# Patient Record
Sex: Female | Born: 1980 | Race: Black or African American | Hispanic: No | Marital: Single | Smoking: Former smoker
Health system: Southern US, Community
[De-identification: ages and names within clinical notes are randomized; demographics above are authoritative.]

## PROBLEM LIST (undated history)

## (undated) DIAGNOSIS — K219 Gastro-esophageal reflux disease without esophagitis: Secondary | ICD-10-CM

## (undated) DIAGNOSIS — I10 Essential (primary) hypertension: Secondary | ICD-10-CM

## (undated) DIAGNOSIS — B019 Varicella without complication: Secondary | ICD-10-CM

## (undated) DIAGNOSIS — F32A Depression, unspecified: Secondary | ICD-10-CM

## (undated) DIAGNOSIS — E785 Hyperlipidemia, unspecified: Secondary | ICD-10-CM

## (undated) DIAGNOSIS — J309 Allergic rhinitis, unspecified: Secondary | ICD-10-CM

## (undated) DIAGNOSIS — F329 Major depressive disorder, single episode, unspecified: Secondary | ICD-10-CM

## (undated) DIAGNOSIS — B977 Papillomavirus as the cause of diseases classified elsewhere: Secondary | ICD-10-CM

## (undated) HISTORY — DX: Varicella without complication: B01.9

## (undated) HISTORY — DX: Major depressive disorder, single episode, unspecified: F32.9

## (undated) HISTORY — DX: Essential (primary) hypertension: I10

## (undated) HISTORY — DX: Hyperlipidemia, unspecified: E78.5

## (undated) HISTORY — DX: Depression, unspecified: F32.A

## (undated) HISTORY — DX: Allergic rhinitis, unspecified: J30.9

---

## 2005-05-07 ENCOUNTER — Emergency Department: Payer: Self-pay | Admitting: Emergency Medicine

## 2006-10-26 ENCOUNTER — Emergency Department: Payer: Self-pay | Admitting: Emergency Medicine

## 2007-01-15 ENCOUNTER — Ambulatory Visit: Payer: Self-pay | Admitting: Family Medicine

## 2007-07-14 ENCOUNTER — Emergency Department: Payer: Self-pay | Admitting: Emergency Medicine

## 2008-10-18 ENCOUNTER — Emergency Department: Payer: Self-pay | Admitting: Emergency Medicine

## 2009-01-30 ENCOUNTER — Emergency Department: Payer: Self-pay | Admitting: Emergency Medicine

## 2009-07-25 ENCOUNTER — Emergency Department: Payer: Self-pay | Admitting: Emergency Medicine

## 2010-04-04 ENCOUNTER — Emergency Department: Payer: Self-pay | Admitting: Emergency Medicine

## 2011-10-19 ENCOUNTER — Emergency Department: Payer: Self-pay | Admitting: Emergency Medicine

## 2012-02-18 ENCOUNTER — Emergency Department: Payer: Self-pay | Admitting: Emergency Medicine

## 2013-05-01 ENCOUNTER — Emergency Department: Payer: Self-pay | Admitting: Emergency Medicine

## 2014-01-06 ENCOUNTER — Encounter (HOSPITAL_COMMUNITY): Payer: Self-pay | Admitting: Emergency Medicine

## 2014-01-06 ENCOUNTER — Emergency Department (HOSPITAL_COMMUNITY)
Admission: EM | Admit: 2014-01-06 | Discharge: 2014-01-06 | Disposition: A | Discharge status: Home / Self Care | Payer: 59 | Attending: Emergency Medicine | Admitting: Emergency Medicine

## 2014-01-06 DIAGNOSIS — Z8639 Personal history of other endocrine, nutritional and metabolic disease: Secondary | ICD-10-CM | POA: Insufficient documentation

## 2014-01-06 DIAGNOSIS — Z862 Personal history of diseases of the blood and blood-forming organs and certain disorders involving the immune mechanism: Secondary | ICD-10-CM | POA: Insufficient documentation

## 2014-01-06 DIAGNOSIS — O200 Threatened abortion: Secondary | ICD-10-CM | POA: Insufficient documentation

## 2014-01-06 DIAGNOSIS — R42 Dizziness and giddiness: Secondary | ICD-10-CM | POA: Insufficient documentation

## 2014-01-06 DIAGNOSIS — R5381 Other malaise: Secondary | ICD-10-CM | POA: Insufficient documentation

## 2014-01-06 DIAGNOSIS — O9989 Other specified diseases and conditions complicating pregnancy, childbirth and the puerperium: Secondary | ICD-10-CM | POA: Insufficient documentation

## 2014-01-06 DIAGNOSIS — Z8619 Personal history of other infectious and parasitic diseases: Secondary | ICD-10-CM | POA: Insufficient documentation

## 2014-01-06 DIAGNOSIS — R5383 Other fatigue: Secondary | ICD-10-CM

## 2014-01-06 HISTORY — DX: Papillomavirus as the cause of diseases classified elsewhere: B97.7

## 2014-01-06 LAB — CBC
HEMATOCRIT: 42 % (ref 36.0–46.0)
HEMOGLOBIN: 13.8 g/dL (ref 12.0–15.0)
MCH: 28.5 pg (ref 26.0–34.0)
MCHC: 32.9 g/dL (ref 30.0–36.0)
MCV: 86.8 fL (ref 78.0–100.0)
Platelets: 273 10*3/uL (ref 150–400)
RBC: 4.84 MIL/uL (ref 3.87–5.11)
RDW: 12.9 % (ref 11.5–15.5)
WBC: 6.2 10*3/uL (ref 4.0–10.5)

## 2014-01-06 LAB — URINALYSIS, ROUTINE W REFLEX MICROSCOPIC
Bilirubin Urine: NEGATIVE
GLUCOSE, UA: NEGATIVE mg/dL
Ketones, ur: NEGATIVE mg/dL
Nitrite: NEGATIVE
PH: 8 (ref 5.0–8.0)
PROTEIN: 30 mg/dL — AB
Specific Gravity, Urine: 1.023 (ref 1.005–1.030)
Urobilinogen, UA: 0.2 mg/dL (ref 0.0–1.0)

## 2014-01-06 LAB — ABO/RH: ABO/RH(D): O POS

## 2014-01-06 LAB — WET PREP, GENITAL
Trich, Wet Prep: NONE SEEN
YEAST WET PREP: NONE SEEN

## 2014-01-06 LAB — PREGNANCY, URINE: PREG TEST UR: POSITIVE — AB

## 2014-01-06 LAB — URINE MICROSCOPIC-ADD ON

## 2014-01-06 LAB — HCG, QUANTITATIVE, PREGNANCY: hCG, Beta Chain, Quant, S: 969 m[IU]/mL — ABNORMAL HIGH (ref <5)

## 2014-01-06 NOTE — ED Provider Notes (Signed)
CSN: [id number]     Arrival date & time 01/06/14  7867 History   First MD Initiated Contact with Patient 01/06/14 [phone]     Chief Complaint  Patient presents with  . Vaginal Bleeding   HPI  Mary Carpenter is a 33 y.o. female with a PMH of PCOS and HPV who presents to the ED for evaluation of vaginal bleeding. History was provided by the patient. Patient states she has had vaginal bleeding since May 3rd (past 10 days). Started as spotting, which has increased in volume the past 2 days. No passage of clots or fetal tissue/masses. Patient went to her OB/GYN yesterday (Dr. Dear) with a positive pregnancy test. LNMP 12/14/13. G0P0. Hx of irregular menses. Patient is currently sexually active with no new partners. Had lower abdominal cramping last week but this has resolved. No abdominal pain currently. Also has had generalized weakness, decreased appetite, and fatigue. Intermittent episodes of lightheadedness. No fever, chills, emesis, nausea, diarrhea, constipation, vaginal discharge, dysuria, genital sores, headaches.    Past Medical History  Diagnosis Date  . Polycystic ovarian syndrome   . HPV (human papilloma virus) infection    History reviewed. No pertinent past surgical history. No family history on file. History  Substance Use Topics  . Smoking status: Never Smoker   . Smokeless tobacco: Not on file  . Alcohol Use: No   OB History   Grav Para Term Preterm Abortions TAB SAB Ect Mult Living                 Review of Systems  Constitutional: Positive for appetite change and fatigue. Negative for fever, chills, diaphoresis and activity change.  Respiratory: Negative for cough and shortness of breath.   Cardiovascular: Negative for chest pain.  Gastrointestinal: Negative for nausea, vomiting, abdominal pain (resolved), diarrhea and constipation.  Genitourinary: Positive for vaginal bleeding. Negative for dysuria, hematuria, flank pain, vaginal discharge, difficulty urinating, genital  sores, vaginal pain and pelvic pain.  Musculoskeletal: Negative for back pain and myalgias.  Neurological: Positive for weakness (generalized) and light-headedness (intermittent). Negative for dizziness and headaches.    Allergies  Review of patient's allergies indicates no known allergies.  Home Medications   Prior to Admission medications   Not on File   BP 140/84  Pulse 80  Temp(Src) 98.1 F (36.7 C) (Oral)  Resp 18  SpO2 100%  LMP 12/14/2013  Filed Vitals:   01/06/14 0844 01/06/14 1017  BP: 140/84 128/73  Pulse: 80 72  Temp: 98.1 F (36.7 C)   TempSrc: Oral   Resp: 18 16  SpO2: 100% 100%    Physical Exam  Nursing note and vitals reviewed. Constitutional: She is oriented to person, place, and time. She appears well-developed and well-nourished. No distress.  HENT:  Head: Normocephalic and atraumatic.  Right Ear: External ear normal.  Left Ear: External ear normal.  Mouth/Throat: Oropharynx is clear and moist.  Eyes: Conjunctivae are normal. Right eye exhibits no discharge. Left eye exhibits no discharge.  Neck: Neck supple.  Cardiovascular: Normal rate, regular rhythm and normal heart sounds.  Exam reveals no gallop and no friction rub.   No murmur heard. Pulmonary/Chest: Effort normal and breath sounds normal. No respiratory distress. She has no wheezes. She has no rales. She exhibits no tenderness.  Abdominal: Soft. Bowel sounds are normal. She exhibits no distension and no mass. There is no tenderness. There is no rebound and no guarding.  Musculoskeletal: Normal range of motion. She exhibits no edema and no  tenderness.  No CVA, lumbar, or flank tenderness bilaterally.   Neurological: She is alert and oriented to person, place, and time.  Skin: Skin is warm and dry. She is not diaphoretic.    ED Course  Procedures (including critical care time) Labs Review Labs Reviewed - No data to display  Imaging Review No results found.   EKG  Interpretation None      Results for orders placed during the hospital encounter of 01/06/14  WET PREP, GENITAL      Result Value Ref Range   Yeast Wet Prep HPF POC NONE SEEN  NONE SEEN   Trich, Wet Prep NONE SEEN  NONE SEEN   Clue Cells Wet Prep HPF POC FEW (*) NONE SEEN   WBC, Wet Prep HPF POC FEW (*) NONE SEEN  PREGNANCY, URINE      Result Value Ref Range   Preg Test, Ur POSITIVE (*) NEGATIVE  URINALYSIS, ROUTINE W REFLEX MICROSCOPIC      Result Value Ref Range   Color, Urine RED (*) YELLOW   APPearance CLOUDY (*) CLEAR   Specific Gravity, Urine 1.023  1.005 - 1.030   pH 8.0  5.0 - 8.0   Glucose, UA NEGATIVE  NEGATIVE mg/dL   Hgb urine dipstick LARGE (*) NEGATIVE   Bilirubin Urine NEGATIVE  NEGATIVE   Ketones, ur NEGATIVE  NEGATIVE mg/dL   Protein, ur 30 (*) NEGATIVE mg/dL   Urobilinogen, UA 0.2  0.0 - 1.0 mg/dL   Nitrite NEGATIVE  NEGATIVE   Leukocytes, UA SMALL (*) NEGATIVE  URINE MICROSCOPIC-ADD ON      Result Value Ref Range   Squamous Epithelial / LPF FEW (*) RARE   WBC, UA 3-6  <3 WBC/hpf   RBC / HPF TOO NUMEROUS TO COUNT  <3 RBC/hpf   Bacteria, UA FEW (*) RARE  CBC      Result Value Ref Range   WBC 6.2  4.0 - 10.5 K/uL   RBC 4.84  3.87 - 5.11 MIL/uL   Hemoglobin 13.8  12.0 - 15.0 g/dL   HCT 16.142.0  09.636.0 - 04.546.0 %   MCV 86.8  78.0 - 100.0 fL   MCH 28.5  26.0 - 34.0 pg   MCHC 32.9  30.0 - 36.0 g/dL   RDW 40.912.9  81.111.5 - 91.415.5 %   Platelets 273  150 - 400 K/uL  HCG, QUANTITATIVE, PREGNANCY      Result Value Ref Range   hCG, Beta Chain, Quant, S 969 (*) <5 mIU/mL  ABO/RH      Result Value Ref Range   ABO/RH(D) O POS     No rh immune globuloin NOT A RH IMMUNE GLOBULIN CANDIDATE, PT RH POSITIVE       MDM   Vanessa DurhamKawana Sutherlin is a 33 y.o. female with a PMH of PCOS and HPV who presents to the ED for evaluation of vaginal bleeding, which is possibly due to a threatened abortion. Patient's Beta HCG 969. H&H stable. Rh positive. Pelvic exam revealed mild vaginal  bleeding with no significant hemorrhage. Os is closed. UA not highly suggestive of a UTI but will send for culture. Patient had no complaints of pain. Abdominal exam benign. Patient has appointment with OB/GYN for blood work tomorrow and Monday for an US. Return precautions, discharge instructions, and follow-up was discussed with the patient before discharge.    Rechecks  10:45 AM = Pelvic exam performed at bedside with ED tech present. Minimal-moderate amount of blood present in the vaginal  vault. No active bleeding/hemorrhage, clots, or tissue. No lacerations to the vaginal walls. No CMT or adnexal tenderness. No vaginal discharge. Os is closed.     There are no discharge medications for this patient.    Final impressions: 1. Threatened abortion       Luiz IronJessica Katlin Hans Rusher PA-C           Jillyn LedgerJessica K Joan Avetisyan, PA-C 01/07/14 [phone]

## 2014-01-06 NOTE — Discharge Instructions (Signed)
Your Beta HCG level was 969 on today's visit - have repeat level drawn tomorrow at your scheduled appointment and keep US appointment on Monday Drink plenty of fluids and rest  Return to the emergency department if you develop any changing/worsening condition, severe abdominal or back pain, feeling faint, vaginal hemorrhage, or any other concerns (please read additional information regarding your condition below)   Threatened Miscarriage Bleeding during the first 20 weeks of pregnancy is common. This is sometimes called a threatened miscarriage. This is a pregnancy that is threatening to end before the twentieth week of pregnancy. Often this bleeding stops with bed rest or decreased activities as suggested by your caregiver and the pregnancy continues without any more problems. You may be asked to not have sexual intercourse, have orgasms or use tampons until further notice. Sometimes a threatened miscarriage can progress to a complete or incomplete miscarriage. This may or may not require further treatment. Some miscarriages occur before a woman misses a menstrual period and knows she is pregnant. Miscarriages occur in 15 to 20% of all pregnancies and usually occur during the first 13 weeks of the pregnancy. The exact cause of a miscarriage is usually never known. A miscarriage is natures way of ending a pregnancy that is abnormal or would not make it to term. There are some things that may put you at risk to have a miscarriage, such as:  Hormone problems.  Infection of the uterus or cervix.  Chronic illness, diabetes for example, especially if it is not controlled.  Abnormal shaped uterus.  Fibroids in the uterus.  Incompetent cervix (the cervix is too weak to hold the baby).  Smoking.  Drinking too much alcohol. It's best not to drink any alcohol when you are pregnant.  Taking illegal drugs. TREATMENT  When a miscarriage becomes complete and all products of conception (all the tissue in  the uterus) have been passed, often no treatment is needed. If you think you passed tissue, save it in a container and take it to your doctor for evaluation. If the miscarriage is incomplete (parts of the fetus or placenta remain in the uterus), further treatment may be needed. The most common reason for further treatment is continued bleeding (hemorrhage) because pregnancy tissue did not pass out of the uterus. This often occurs if a miscarriage is incomplete. Tissue left behind may also become infected. Treatment usually is dilatation and curettage (the removal of the remaining products of pregnancy. This can be done by a simple sucking procedure (suction curettage) or a simple scraping of the inside of the uterus. This may be done in the hospital or in the caregiver's office. This is only done when your caregiver knows that there is no chance for the pregnancy to proceed to term. This is determined by physical examination, negative pregnancy test, falling pregnancy hormone count and/or, an ultrasound revealing a dead fetus. Miscarriages are often a very emotional time for prospective mothers and fathers. This is not you or your partners fault. It did not occur because of an inadequacy in you or your partner. Nearly all miscarriages occur because the pregnancy has started off wrongly. At least half of these pregnancies have a chromosomal abnormality. It is almost always not inherited. Others may have developmental problems with the fetus or placenta. This does not always show up even when the products miscarried are studied under the microscope. The miscarriage is nearly always not your fault and it is not likely that you could have prevented it from  happening. If you are having emotional and grieving problems, talk to your health care provider and even seek counseling, if necessary, before getting pregnant again. You can begin trying for another pregnancy as soon as your caregiver says it is OK. HOME CARE  INSTRUCTIONS   Your caregiver may order bed rest depending on how much bleeding and cramping you are having. You may be limited to only getting up to go to the bathroom. You may be allowed to continue light activity. You may need to make arrangements for the care of your other children and for any other responsibilities.  Keep track of the number of pads you use each day, how often you have to change pads and how saturated (soaked) they are. Record this information.  DO NOT USE TAMPONS. Do not douche, have sexual intercourse or orgasms until approved by your caregiver.  You may receive a follow up appointment for re-evaluation of your pregnancy and a repeat blood test. Re-evaluation often occurs after 2 days and again in 4 to 6 weeks. It is very important that you follow-up in the recommended time period.  If you are Rh negative and the father is Rh positive or you do not know the fathers' blood type, you may receive a shot (Rh immune globulin) to help prevent abnormal antibodies that can develop and affect the baby in any future pregnancies. SEEK IMMEDIATE MEDICAL CARE IF:  You have severe cramps in your stomach, back, or abdomen.  You have a sudden onset of severe pain in the lower part of your abdomen.  You develop chills.  You run an unexplained temperature of 101 F (38.3 C) or higher.  You pass large clots or tissue. Save any tissue for your caregiver to inspect.  Your bleeding increases or you become light-headed, weak, or have fainting episodes.  You have a gush of fluid from your vagina.  You pass out. This could mean you have a tubal (ectopic) pregnancy. Document Released: 08/13/2005 Document Revised: 11/05/2011 Document Reviewed: 03/29/2008 [facility] Patient Information 2014 [place].   Vaginal Bleeding During Pregnancy, First Trimester A small amount of bleeding (spotting) from the vagina is relatively common in early pregnancy. It usually stops on its own. Various  things may cause bleeding or spotting in early pregnancy. Some bleeding may be related to the pregnancy, and some may not. In most cases, the bleeding is normal and is not a problem. However, bleeding can also be a sign of something serious. Be sure to tell your health care provider about any vaginal bleeding right away. Some possible causes of vaginal bleeding during the first trimester include:  Infection or inflammation of the cervix.  Growths (polyps) on the cervix.  Miscarriage or threatened miscarriage.  Pregnancy tissue has developed outside of the uterus and in a fallopian tube (tubal pregnancy).  Tiny cysts have developed in the uterus instead of pregnancy tissue (molar pregnancy). HOME CARE INSTRUCTIONS  Watch your condition for any changes. The following actions may help to lessen any discomfort you are feeling:  Follow your health care provider's instructions for limiting your activity. If your health care provider orders bed rest, you may need to stay in bed and only get up to use the bathroom. However, your health care provider may allow you to continue light activity.  If needed, make plans for someone to help with your regular activities and responsibilities while you are on bed rest.  Keep track of the number of pads you use each day, how  often you change pads, and how soaked (saturated) they are. Write this down.  Do not use tampons. Do not douche.  Do not have sexual intercourse or orgasms until approved by your health care provider.  If you pass any tissue from your vagina, save the tissue so you can show it to your health care provider.  Only take over-the-counter or prescription medicines as directed by your health care provider.  Do not take aspirin because it can make you bleed.  Keep all follow-up appointments as directed by your health care provider. SEEK MEDICAL CARE IF:  You have any vaginal bleeding during any part of your pregnancy.  You have cramps  or labor pains. SEEK IMMEDIATE MEDICAL CARE IF:   You have severe cramps in your back or belly (abdomen).  You have a fever, not controlled by medicine.  You pass large clots or tissue from your vagina.  Your bleeding increases.  You feel lightheaded or weak, or you have fainting episodes.  You have chills.  You are leaking fluid or have a gush of fluid from your vagina.  You pass out while having a bowel movement. MAKE SURE YOU:  Understand these instructions.  Will watch your condition.  Will get help right away if you are not doing well or get worse. Document Released: 05/23/2005 Document Revised: 06/03/2013 Document Reviewed: 04/20/2013 [facility] Patient Information 2014 [place], [place].

## 2014-01-06 NOTE — ED Notes (Signed)
RN attempt to draw blood with IV start. Unsuccessful. Saa, made aware will come get blood.

## 2014-01-06 NOTE — ED Notes (Signed)
Pt reports vaginal bleeding since May 3rd, was told yesterday by her OBGYN that she was pregnant. States wasn;t really told what was going on, was just dx with threatened miscarriage. Has polycystic ovarian syndrome told could have false positive pregnancy test.

## 2014-01-07 LAB — GC/CHLAMYDIA PROBE AMP
CT Probe RNA: NEGATIVE
GC Probe RNA: NEGATIVE

## 2014-01-07 LAB — URINE CULTURE: Colony Count: 15000

## 2014-01-07 NOTE — ED Provider Notes (Signed)
Medical screening examination/treatment/procedure(s) were performed by non-physician practitioner and as supervising physician I was immediately available for consultation/collaboration.   EKG Interpretation None       Raeford RazorStephen Rishan Oyama, MD 01/07/14 [phone]

## 2014-02-28 ENCOUNTER — Emergency Department: Payer: Self-pay | Admitting: Emergency Medicine

## 2015-02-16 ENCOUNTER — Encounter: Payer: Self-pay | Admitting: Urgent Care

## 2015-02-16 DIAGNOSIS — K088 Other specified disorders of teeth and supporting structures: Secondary | ICD-10-CM | POA: Diagnosis not present

## 2015-02-16 NOTE — ED Notes (Signed)
Patient presents with c/o LEFT lower jaw pain - scheduled for an extraction. (+) swelling at time. Denies fever.

## 2015-02-17 ENCOUNTER — Emergency Department
Admission: EM | Admit: 2015-02-17 | Discharge: 2015-02-17 | Discharge status: Against Medical Advice | Payer: 59 | Attending: Emergency Medicine | Admitting: Emergency Medicine

## 2015-05-12 ENCOUNTER — Ambulatory Visit: Payer: 59 | Admitting: Podiatry

## 2015-07-10 ENCOUNTER — Emergency Department
Admission: EM | Admit: 2015-07-10 | Discharge: 2015-07-10 | Disposition: A | Discharge status: Home / Self Care | Payer: 59 | Attending: Emergency Medicine | Admitting: Emergency Medicine

## 2015-07-10 ENCOUNTER — Encounter: Payer: Self-pay | Admitting: Emergency Medicine

## 2015-07-10 DIAGNOSIS — J069 Acute upper respiratory infection, unspecified: Secondary | ICD-10-CM | POA: Diagnosis not present

## 2015-07-10 DIAGNOSIS — R05 Cough: Secondary | ICD-10-CM | POA: Diagnosis present

## 2015-07-10 MED ORDER — PSEUDOEPH-BROMPHEN-DM 30-2-10 MG/5ML PO SYRP: 5.0000 mL | ORAL_SOLUTION | Freq: Four times a day (QID) | ORAL | Status: DC | PRN

## 2015-07-10 NOTE — ED Notes (Signed)
C/o cough, chest congestion x 1 week

## 2015-07-10 NOTE — ED Provider Notes (Signed)
[facility] Emergency Department Provider Note  ____________________________________________  Time seen: Approximately 7:32 PM  I have reviewed the triage vital signs and the nursing notes.   HISTORY  Chief Complaint Cough and Nasal Congestion    HPI Mary Carpenter is a 34 y.o. female patient complaining of cough and chest congestion 1 week. Denies fever or chills or nausea vomiting diarrhea. Patient state there is some sinus congestion and intermittent productive and nonproductive cough. Patient stated there is no relief taking over-the-counter cough preparations. Patient denies tobacco use. No other palliative measures taken for this complaint. Patient rated her pain discomfort as a 7/10.   Past Medical History  Diagnosis Date  . Polycystic ovarian syndrome   . HPV (human papilloma virus) infection     There are no active problems to display for this patient.   History reviewed. No pertinent past surgical history.  No current outpatient prescriptions on file.  Allergies Review of patient's allergies indicates no known allergies.  No family history on file.  Social History Social History  Substance Use Topics  . Smoking status: Never Smoker   . Smokeless tobacco: None  . Alcohol Use: No    Review of Systems Constitutional: No fever/chills Eyes: No visual changes. ENT: No sore throat. Sinus congestion Cardiovascular: Denies chest pain. Respiratory: Denies shortness of breath. He didn't admitting productive nonproductive cough Gastrointestinal: No abdominal pain.  No nausea, no vomiting.  No diarrhea.  No constipation. Genitourinary: Negative for dysuria. Musculoskeletal: Chest pain secondary to cough Skin: Negative for rash. Neurological: Negative for headaches, focal weakness or numbness. 10-point ROS otherwise negative.  ____________________________________________   PHYSICAL EXAM:  VITAL SIGNS: ED Triage Vitals  Enc  Vitals Group     BP 07/10/15 1839 147/83 mmHg     Pulse Rate 07/10/15 1839 98     Resp 07/10/15 1839 18     Temp 07/10/15 1839 98.1 F (36.7 C)     Temp Source 07/10/15 1839 Oral     SpO2 07/10/15 1839 98 %     Weight 07/10/15 1839 180 lb (81.647 kg)     Height 07/10/15 1839  (1.727 m)     Head Cir --      Peak Flow --      Pain Score 07/10/15 1840 7     Pain Loc --      Pain Edu? --      Excl. in GC? --     Constitutional: Alert and oriented. Well appearing and in no acute distress. Eyes: Conjunctivae are normal. PERRL. EOMI. Head: Atraumatic. Nose: Bilateral maxillary sinus guarding. Edematous bilateral nasal turbinates. Clear rhinorrhea. Mouth/Throat: Mucous membranes are moist.  Oropharynx non-erythematous. Postnasal drainage. Neck: No stridor. No cervical spine tenderness to palpation. Hematological/Lymphatic/Immunilogical: No cervical lymphadenopathy. Cardiovascular: Normal rate, regular rhythm. Grossly normal heart sounds.  Good peripheral circulation. Respiratory: Normal respiratory effort.  No retractions. Lungs CTAB. Nonproductive cough at this time. Gastrointestinal: Soft and nontender. No distention. No abdominal bruits. No CVA tenderness. Musculoskeletal: No lower extremity tenderness nor edema.  No joint effusions. Neurologic:  Normal speech and language. No gross focal neurologic deficits are appreciated. No gait instability. Skin:  Skin is warm, dry and intact. No rash noted. Psychiatric: Mood and affect are normal. Speech and behavior are normal.  ____________________________________________   LABS (all labs ordered are listed, but only abnormal results are displayed)  Labs Reviewed - No data to display ____________________________________________  EKG   ____________________________________________  RADIOLOGY  ____________________________________________   PROCEDURES  Procedure(s) performed: None  Critical Care performed:  No  ____________________________________________   INITIAL IMPRESSION / ASSESSMENT AND PLAN / ED COURSE  Pertinent labs & imaging results that were available during my care of the patient were reviewed by me and considered in my medical decision making (see chart for details).  Upper rest or infection. Discussed not using antibiotics. Patient given a prescription for Bromfed-DM. Advised to take Tylenol or ibuprofen for body aches. Advised patient to [facility]. ____________________________________________   FINAL CLINICAL IMPRESSION(S) / ED DIAGNOSES  Final diagnoses:  URI (upper respiratory infection)      Joni ReiningRonald K Zyliah Schier, PA-C 07/10/15 1941  Darien Ramusavid W Kaminski, MD 07/10/15 2241

## 2015-07-10 NOTE — Discharge Instructions (Signed)
Upper Respiratory Infection, Adult Most upper respiratory infections (URIs) are a viral infection of the air passages leading to the lungs. A URI affects the nose, throat, and upper air passages. The most common type of URI is nasopharyngitis and is typically referred to as "the common cold." URIs run their course and usually go away on their own. Most of the time, a URI does not require medical attention, but sometimes a bacterial infection in the upper airways can follow a viral infection. This is called a secondary infection. Sinus and middle ear infections are common types of secondary upper respiratory infections. Bacterial pneumonia can also complicate a URI. A URI can worsen asthma and chronic obstructive pulmonary disease (COPD). Sometimes, these complications can require emergency medical care and may be life threatening.  CAUSES Almost all URIs are caused by viruses. A virus is a type of germ and can spread from one person to another.  RISKS FACTORS You may be at risk for a URI if:   You smoke.   You have chronic heart or lung disease.  You have a weakened defense (immune) system.   You are very young or very old.   You have nasal allergies or asthma.  You work in crowded or poorly ventilated areas.  You work in health care facilities or schools. SIGNS AND SYMPTOMS  Symptoms typically develop 2-3 days after you come in contact with a cold virus. Most viral URIs last 7-10 days. However, viral URIs from the influenza virus (flu virus) can last 14-18 days and are typically more severe. Symptoms may include:   Runny or stuffy (congested) nose.   Sneezing.   Cough.   Sore throat.   Headache.   Fatigue.   Fever.   Loss of appetite.   Pain in your forehead, behind your eyes, and over your cheekbones (sinus pain).  Muscle aches.  DIAGNOSIS  Your health care provider may diagnose a URI by:  Physical exam.  Tests to check that your symptoms are not due to  another condition such as:  Strep throat.  Sinusitis.  Pneumonia.  Asthma. TREATMENT  A URI goes away on its own with time. It cannot be cured with medicines, but medicines may be prescribed or recommended to relieve symptoms. Medicines may help:  Reduce your fever.  Reduce your cough.  Relieve nasal congestion. HOME CARE INSTRUCTIONS   Take medicines only as directed by your health care provider.   Gargle warm saltwater or take cough drops to comfort your throat as directed by your health care provider.  Use a warm mist humidifier or inhale steam from a shower to increase air moisture. This may make it easier to breathe.  Drink enough fluid to keep your urine clear or pale yellow.   Eat soups and other clear broths and maintain good nutrition.   Rest as needed.   Return to work when your temperature has returned to normal or as your health care provider advises. You may need to stay home longer to avoid infecting others. You can also use a face mask and careful hand washing to prevent spread of the virus.  Increase the usage of your inhaler if you have asthma.   Do not use any tobacco products, including cigarettes, chewing tobacco, or electronic cigarettes. If you need help quitting, ask your health care provider. PREVENTION  The best way to protect yourself from getting a cold is to practice good hygiene.   Avoid oral or hand contact with people with cold   symptoms.   Wash your hands often if contact occurs.  There is no clear evidence that vitamin C, vitamin E, echinacea, or exercise reduces the chance of developing a cold. However, it is always recommended to get plenty of rest, exercise, and practice good nutrition.  SEEK MEDICAL CARE IF:   You are getting worse rather than better.   Your symptoms are not controlled by medicine.   You have chills.  You have worsening shortness of breath.  You have brown or red mucus.  You have yellow or brown nasal  discharge.  You have pain in your face, especially when you bend forward.  You have a fever.  You have swollen neck glands.  You have pain while swallowing.  You have white areas in the back of your throat. SEEK IMMEDIATE MEDICAL CARE IF:   You have severe or persistent:  Headache.  Ear pain.  Sinus pain.  Chest pain.  You have chronic lung disease and any of the following:  Wheezing.  Prolonged cough.  Coughing up blood.  A change in your usual mucus.  You have a stiff neck.  You have changes in your:  Vision.  Hearing.  Thinking.  Mood. MAKE SURE YOU:   Understand these instructions.  Will watch your condition.  Will get help right away if you are not doing well or get worse.   This information is not intended to replace advice given to you by your health care provider. Make sure you discuss any questions you have with your health care provider.   Document Released: 02/06/2001 Document Revised: 12/28/2014 Document Reviewed: 11/18/2013 Elsevier Interactive Patient Education 2016 [employer]  

## 2015-09-05 ENCOUNTER — Encounter: Payer: Self-pay | Admitting: Emergency Medicine

## 2015-09-05 ENCOUNTER — Emergency Department
Admission: EM | Admit: 2015-09-05 | Discharge: 2015-09-05 | Disposition: A | Discharge status: Home / Self Care | Payer: 59 | Attending: Emergency Medicine | Admitting: Emergency Medicine

## 2015-09-05 DIAGNOSIS — J0100 Acute maxillary sinusitis, unspecified: Secondary | ICD-10-CM | POA: Diagnosis not present

## 2015-09-05 DIAGNOSIS — R0981 Nasal congestion: Secondary | ICD-10-CM | POA: Diagnosis present

## 2015-09-05 MED ORDER — FLUTICASONE PROPIONATE 50 MCG/ACT NA SUSP: 1.0000 | Freq: Two times a day (BID) | NASAL | Status: DC

## 2015-09-05 MED ORDER — CETIRIZINE HCL 10 MG PO TABS: 10.0000 mg | ORAL_TABLET | Freq: Every day | ORAL | Status: DC

## 2015-09-05 MED ORDER — AMOXICILLIN-POT CLAVULANATE 875-125 MG PO TABS: 1.0000 | ORAL_TABLET | Freq: Two times a day (BID) | ORAL | Status: DC

## 2015-09-05 NOTE — Discharge Instructions (Signed)

## 2015-09-05 NOTE — ED Provider Notes (Signed)
[facility] Emergency Department Provider Note ?  ? ____________________________________________ ? Time seen: 6:17 PM ? I have reviewed the triage vital signs and the nursing notes.  ________ HISTORY ? Chief Complaint Nasal Congestion     HPI  Mary Carpenter is a 35 y.o. female   who presents emergency department complaining of nasal congestion, and sinus pressure. Patient states that she does have a history of sinus infections and this feels the same. Patient denies any fevers or chills, difficulty breathing, chest pain, shortness of breath, nausea or vomiting. Patient has tried Mucinex with minimal relief. ? ? ? Past Medical History  Diagnosis Date  . Polycystic ovarian syndrome   . HPV (human papilloma virus) infection     There are no active problems to display for this patient.  ? History reviewed. No pertinent past surgical history. ? Current Outpatient Rx  Name  Route  Sig  Dispense  Refill  . amoxicillin-clavulanate (AUGMENTIN) 875-125 MG tablet   Oral   Take 1 tablet by mouth 2 (two) times daily.   14 tablet   0   . brompheniramine-pseudoephedrine-DM 30-2-10 MG/5ML syrup   Oral   Take 5 mLs by mouth 4 (four) times daily as needed.   120 mL   0   . cetirizine (ZYRTEC) 10 MG tablet   Oral   Take 1 tablet (10 mg total) by mouth daily.   30 tablet   0   . fluticasone (FLONASE) 50 MCG/ACT nasal spray   Each Nare   Place 1 spray into both nostrils 2 (two) times daily.   16 g   0    ? Allergies Review of patient's allergies indicates no known allergies. ? History reviewed. No pertinent family history. ? Social History Social History  Substance Use Topics  . Smoking status: Never Smoker   . Smokeless tobacco: None  . Alcohol Use: No   ? Review of Systems Constitutional: no fever. Eyes: no discharge ENT: no sore throat. Positive for nasal congestion and sinus pressure. Cardiovascular: no chest pain. Respiratory:  no cough. No sob Gastrointestinal: denies abdominal pain, vomiting, diarrhea, and constipation Genitourinary: no dysuria. Negative for hematuria Musculoskeletal: Negative for back pain. Skin: Negative for rash. Neurological: Negative for headaches  10-point ROS otherwise negative.  _______________ PHYSICAL EXAM: ? VITAL SIGNS:   ED Triage Vitals  Enc Vitals Group     BP 09/05/15 1611 156/79 mmHg     Pulse Rate 09/05/15 1611 81     Resp 09/05/15 1611 18     Temp 09/05/15 1611 98.2 F (36.8 C)     Temp Source 09/05/15 1611 Oral     SpO2 09/05/15 1611 97 %     Weight 09/05/15 1611 175 lb (79.379 kg)     Height 09/05/15 1611 5\' 8"  (1.727 m)     Head Cir --      Peak Flow --      Pain Score --      Pain Loc --      Pain Edu? --      Excl. in GC? --    ?  Constitutional: Alert and oriented. Well appearing and in no distress. Eyes: Conjunctivae are normal.  ENT      Head: Normocephalic and atraumatic.      Ears: EACs and TMs are unremarkable bilaterally.      Nose: Moderate purulent congestion/rhinnorhea. Tender to percussion over bilateral maxillary sinuses.      Mouth/Throat: Mucous membranes are  moist.   Hematological/Lymphatic/Immunilogical: No cervical lymphadenopathy. Cardiovascular: Normal rate, regular rhythm. Normal S1 and S2. Respiratory: Normal respiratory effort without tachypnea nor retractions. Lungs CTAB. Gastrointestinal: Soft and nontender. No distention. There is no CVA tenderness. Genitourinary:  Musculoskeletal: Nontender with normal range of motion in all extremities.  Neurologic:  Normal speech and language. No gross focal neurologic deficits are appreciated. Skin:  Skin is warm, dry and intact. No rash noted. Psychiatric: Mood and affect are normal. Speech and behavior are normal. Patient exhibits appropriate insight and judgment.    LABS (all labs ordered are listed, but only abnormal results are displayed)  Labs Reviewed - No data to  display  ___________ RADIOLOGY    _____________ PROCEDURES ? Procedure(s) performed:    Medications - No data to display  ______________________________________________________ INITIAL IMPRESSION / ASSESSMENT AND PLAN / ED COURSE ? Pertinent labs & imaging results that were available during my care of the patient were reviewed by me and considered in my medical decision making (see chart for details).    She is diagnoses is consistent with acute maxillary sinusitis. Patient will placed on antibiotics, Zyrtec, Flonase for symptom control. Patient will follow up with primary care for any symptoms persisting past this treatment course.    New Prescriptions   AMOXICILLIN-CLAVULANATE (AUGMENTIN) 875-125 MG TABLET    Take 1 tablet by mouth 2 (two) times daily.   CETIRIZINE (ZYRTEC) 10 MG TABLET    Take 1 tablet (10 mg total) by mouth daily.   FLUTICASONE (FLONASE) 50 MCG/ACT NASAL SPRAY    Place 1 spray into both nostrils 2 (two) times daily.   ____________________________________________ FINAL CLINICAL IMPRESSION(S) / ED DIAGNOSES?  Final diagnoses:  Acute maxillary sinusitis, recurrence not specified            Racheal Patches, PA-C 09/05/15 1817  Governor Rooks, MD 09/05/15 2344

## 2015-09-05 NOTE — ED Notes (Signed)
Reports sinus congestion and facial; pain

## 2015-09-05 NOTE — ED Notes (Signed)
Nasal congestion with some sinus pressure since yesterday   Min swelling noted to lower jaw area

## 2015-10-31 ENCOUNTER — Encounter: Payer: Self-pay | Admitting: Family Medicine

## 2015-10-31 ENCOUNTER — Ambulatory Visit (INDEPENDENT_AMBULATORY_CARE_PROVIDER_SITE_OTHER): Payer: 59 | Admitting: Family Medicine

## 2015-10-31 VITALS — BP 128/80 | HR 77 | Temp 98.6°F | Ht 68.5 in | Wt 175.0 lb

## 2015-10-31 DIAGNOSIS — F418 Other specified anxiety disorders: Secondary | ICD-10-CM

## 2015-10-31 DIAGNOSIS — K219 Gastro-esophageal reflux disease without esophagitis: Secondary | ICD-10-CM | POA: Diagnosis not present

## 2015-10-31 DIAGNOSIS — K5900 Constipation, unspecified: Secondary | ICD-10-CM | POA: Insufficient documentation

## 2015-10-31 DIAGNOSIS — F32A Depression, unspecified: Secondary | ICD-10-CM

## 2015-10-31 DIAGNOSIS — G8929 Other chronic pain: Secondary | ICD-10-CM | POA: Diagnosis not present

## 2015-10-31 DIAGNOSIS — R1013 Epigastric pain: Secondary | ICD-10-CM | POA: Diagnosis not present

## 2015-10-31 DIAGNOSIS — R079 Chest pain, unspecified: Secondary | ICD-10-CM | POA: Diagnosis not present

## 2015-10-31 DIAGNOSIS — J309 Allergic rhinitis, unspecified: Secondary | ICD-10-CM

## 2015-10-31 DIAGNOSIS — R0789 Other chest pain: Secondary | ICD-10-CM

## 2015-10-31 DIAGNOSIS — F419 Anxiety disorder, unspecified: Secondary | ICD-10-CM

## 2015-10-31 DIAGNOSIS — R030 Elevated blood-pressure reading, without diagnosis of hypertension: Secondary | ICD-10-CM

## 2015-10-31 DIAGNOSIS — F329 Major depressive disorder, single episode, unspecified: Secondary | ICD-10-CM | POA: Insufficient documentation

## 2015-10-31 LAB — POCT URINE PREGNANCY: Preg Test, Ur: NEGATIVE

## 2015-10-31 MED ORDER — PANTOPRAZOLE SODIUM 40 MG PO TBEC: 40.0000 mg | DELAYED_RELEASE_TABLET | Freq: Every day | ORAL | Status: DC

## 2015-10-31 MED ORDER — POLYETHYLENE GLYCOL 3350 17 GM/SCOOP PO POWD: 17.0000 g | Freq: Every day | ORAL | Status: DC | PRN

## 2015-10-31 NOTE — Assessment & Plan Note (Signed)
Stable per patient. No SI or HI. She will continue to follow with her therapist. Advised that if the symptoms worsened there are additional resources for her. She is given return precautions.

## 2015-10-31 NOTE — Progress Notes (Signed)
Pre visit review using our clinic review tool, if applicable. No additional management support is needed unless otherwise documented below in the visit note. 

## 2015-10-31 NOTE — Progress Notes (Signed)
Patient ID: Mary Carpenter, female   DOB: 11-29-1980, 35 y.o.   MRN: [id number]  Tommi Rumps, MD Phone: [phone]  Mary Carpenter is a 35 y.o. female who presents today for new patient visit.  Patient presents with a variety of complaints today. She notes long history of heartburn. She describes the heartburn as a burning sensation that occurs intermittently depending on what she eats. She additionally notes a mild pressure sensation in her epigastric and left upper quadrant regions in her abdomen that has started to radiate up to her central chest in a sharp manner. She notes no exertional component to this. No diaphoresis, palpitations, or shortness of breath with this. No history of PE or DVT. No recent long trips or surgeries. She has no chest pain at this time. She has no abdominal pain at this time. She notes the discomfort worsens with eating certain things such as spicy foods or red meat or raw vegetables. She notes the discomfort only occurs when she eats. She notes she goes through a roll of tums once a week. She does not take any other medication for reflux. She notes some constipation and having bowel movements every 2 days. She notes she has normal bowel movements. Sometimes she goes 3-5 days between bowel movements. She has menstrual periods monthly. Her last menstrual period was February 24. She is sexually active. No vaginal or urinary symptoms.  Allergic rhinitis: Patient notes long-standing intermittent history of sinus congestion and rhinorrhea. She's been treated with antibiotics, Claritin, and Mucinex recently. She notes her symptoms flare up when she is certain places such as at her boyfriend's apartment when his heat is on. Notes she feels as though she reacts to something in the air. Right now she is using over-the-counter congestion medications. No fevers.  Anxiety/depression: Patient notes she had some life-changing events in October. She feels no anxiety or depression  today. Though does feel these things most days. Notes sleep is not good. She is seeing a therapist for this and that has been quite beneficial. Denies SI and HI.  Active Ambulatory Problems    Diagnosis Date Noted  . GERD (gastroesophageal reflux disease) 10/31/2015  . Constipation 10/31/2015  . Atypical chest pain 10/31/2015  . Allergic rhinitis 10/31/2015  . Anxiety and depression 10/31/2015  . Elevated blood pressure (not hypertension) 10/31/2015   Resolved Ambulatory Problems    Diagnosis Date Noted  . No Resolved Ambulatory Problems   Past Medical History  Diagnosis Date  . HPV (human papilloma virus) infection   . Chicken pox   . Depression   . Hyperlipidemia   . Hypertension     Family History  Problem Relation Age of Onset  . Diabetes Mother   . Mental illness Mother   . Hypertension Mother   . Hypertension Father   . Hypertension Maternal Grandmother   . Diabetes Maternal Grandmother   . Diabetes Paternal Grandmother   . Hypertension Paternal Grandmother     Social History   Social History  . Marital Status: Single    Spouse Name: N/A  . Number of Children: N/A  . Years of Education: N/A   Occupational History  . Not on file.   Social History Main Topics  . Smoking status: Former Smoker    Quit date: 10/31/2014  . Smokeless tobacco: Never Used  . Alcohol Use: 0.0 - 1.2 oz/week    0-2 Shots of liquor per week  . Drug Use: No  . Sexual Activity: Yes  Birth Control/ Protection: Condom   Other Topics Concern  . Not on file   Social History Narrative    ROS   General:  Negative for nexplained weight loss, fever Skin: Positive for new or changing mole, negative for sore that won't heal HEENT: Negative for trouble hearing, trouble seeing, ringing in ears, mouth sores, hoarseness, change in voice, dysphagia. CV:  Positive for chest pain, negative for dyspnea, edema, palpitations Resp: Negative for cough, dyspnea, hemoptysis GI: Positive for  constipation, abdominal pain, Negative for nausea, vomiting, diarrhea, melena, hematochezia. GU: Negative for dysuria, incontinence, urinary hesitance, hematuria, vaginal or penile discharge, polyuria, sexual difficulty, lumps in testicle or breasts MSK: Negative for muscle cramps or aches, joint pain or swelling Neuro: Negative for headaches, weakness, numbness, dizziness, passing out/fainting Psych: Positive for depression, anxiety, negative for memory problems  Objective  Physical Exam Filed Vitals:   10/31/15 1446 10/31/15 1528  BP: 132/94 128/80  Pulse: 77   Temp: 98.6 F (37 C)     BP Readings from Last 3 Encounters:  10/31/15 128/80  09/05/15 156/79  07/10/15 147/83   Wt Readings from Last 3 Encounters:  10/31/15 175 lb (79.379 kg)  09/05/15 175 lb (79.379 kg)  07/10/15 180 lb (81.647 kg)    Physical Exam  Constitutional: She is well-developed, well-nourished, and in no distress.  HENT:  Head: Normocephalic and atraumatic.  Right Ear: External ear normal.  Left Ear: External ear normal.  Mouth/Throat: Oropharynx is clear and moist. No oropharyngeal exudate.  Eyes: Conjunctivae are normal. Pupils are equal, round, and reactive to light.  Neck: Neck supple.  Cardiovascular: Normal rate, regular rhythm and normal heart sounds.  Exam reveals no gallop and no friction rub.   No murmur heard. Pulmonary/Chest: Effort normal and breath sounds normal. No respiratory distress. She has no wheezes. She has no rales.  Abdominal: Soft. Bowel sounds are normal. She exhibits no distension. There is no tenderness. There is no rebound and no guarding.  Musculoskeletal: She exhibits no edema.  Lymphadenopathy:    She has no cervical adenopathy.  Neurological: She is alert. Gait normal.  Skin: Skin is warm and dry. She is not diaphoretic.  Psychiatric: Mood and affect normal.   EKG: Normal sinus rhythm, rate 86, no ST or T-wave changes   Assessment/Plan:   GERD  (gastroesophageal reflux disease) Poorly controlled. Is likely the cause of her abdominal discomfort and chest discomfort. She has a benign abdominal exam. Negative urine pregnancy test. We will start her on Protonix to take daily. She'll continue to monitor her symptoms. She will avoid foods that exacerbate her reflux. We will check lab work to evaluate for other causes. She is given return precautions.  Constipation Intermittent constipation. Benign abdominal exam. Possibly also contributing to her abdominal discomfort. We will trial her on MiraLAX as needed for constipation. She will continue to monitor. She is given return precautions.  Atypical chest pain Chest pain is very atypical. Most consistent with reflux. No risk factors for VTE. Vital signs are stable also making VTE unlikely. Unlikely to be cardiac given normal EKG and atypical nature. Unlikely to be pulmonary related given normal vital signs and normal lung exam. History not consistent with MSK cause. We'll treat the patient for reflux. She is given return precautions.  Allergic rhinitis Upper respiratory symptoms likely related to allergies. We will trial her on Flonase and Claritin or Zyrtec over-the-counter.  Anxiety and depression Stable per patient. No SI or HI. She will continue to  follow with her therapist. Durward Fortes that if the symptoms worsened there are additional resources for her. She is given return precautions.  Elevated blood pressure (not hypertension) Elevated on initial check. Recheck in the normal range. We'll continue to monitor.    Orders Placed This Encounter  Procedures  . Comp Met (CMET)  . Lipase  . H. pylori antibody, IgG  . CBC  . POCT urine pregnancy  . EKG 12-Lead    Meds ordered this encounter  Medications  . pantoprazole (PROTONIX) 40 MG tablet    Sig: Take 1 tablet (40 mg total) by mouth daily.    Dispense:  30 tablet    Refill:  3  . polyethylene glycol powder (GLYCOLAX/MIRALAX) powder     Sig: Take 17 g by mouth daily as needed.    Dispense:  3350 g    Refill:  [place], MD [place]

## 2015-10-31 NOTE — Patient Instructions (Signed)
Nice to meet you. We'll start her on Protonix for reflux. You should start on Flonase and Claritin over-the-counter for your allergies. Please continue to see the therapist. Her anxiety and depression. We will check lab work as well to evaluate your abdominal pain. If you develop chest pain, shortness of breath, palpitations, abdominal pain, blood in her stool, vaginal bleeding, thoughts of harming herself or others, or any new or changing symptoms please seek medical attention.

## 2015-10-31 NOTE — Assessment & Plan Note (Addendum)
Upper respiratory symptoms likely related to allergies. We will trial her on Flonase and Claritin or Zyrtec over-the-counter.

## 2015-10-31 NOTE — Assessment & Plan Note (Signed)
Intermittent constipation. Benign abdominal exam. Possibly also contributing to her abdominal discomfort. We will trial her on MiraLAX as needed for constipation. She will continue to monitor. She is given return precautions.

## 2015-10-31 NOTE — Assessment & Plan Note (Signed)
Elevated on initial check. Recheck in the normal range. We'll continue to monitor.

## 2015-10-31 NOTE — Assessment & Plan Note (Signed)
Chest pain is very atypical. Most consistent with reflux. No risk factors for VTE. Vital signs are stable also making VTE unlikely. Unlikely to be cardiac given normal EKG and atypical nature. Unlikely to be pulmonary related given normal vital signs and normal lung exam. History not consistent with MSK cause. We'll treat the patient for reflux. She is given return precautions.

## 2015-10-31 NOTE — Assessment & Plan Note (Signed)
Poorly controlled. Is likely the cause of her abdominal discomfort and chest discomfort. She has a benign abdominal exam. Negative urine pregnancy test. We will start her on Protonix to take daily. She'll continue to monitor her symptoms. She will avoid foods that exacerbate her reflux. We will check lab work to evaluate for other causes. She is given return precautions.

## 2015-11-01 ENCOUNTER — Encounter: Payer: Self-pay | Admitting: Family Medicine

## 2015-11-01 LAB — COMPREHENSIVE METABOLIC PANEL
ALK PHOS: 58 U/L (ref 39–117)
ALT: 17 U/L (ref 0–35)
AST: 17 U/L (ref 0–37)
Albumin: 4.3 g/dL (ref 3.5–5.2)
BILIRUBIN TOTAL: 0.3 mg/dL (ref 0.2–1.2)
BUN: 14 mg/dL (ref 6–23)
CALCIUM: 9.5 mg/dL (ref 8.4–10.5)
CO2: 22 mEq/L (ref 19–32)
Chloride: 106 mEq/L (ref 96–112)
Creatinine, Ser: 0.89 mg/dL (ref 0.40–1.20)
GFR: 94 mL/min (ref >60.00)
Glucose, Bld: 89 mg/dL (ref 70–99)
Potassium: 4.2 mEq/L (ref 3.5–5.1)
Sodium: 144 mEq/L (ref 135–145)
Total Protein: 7 g/dL (ref 6.0–8.3)

## 2015-11-01 LAB — CBC
HCT: 39.5 % (ref 36.0–46.0)
Hemoglobin: 13 g/dL (ref 12.0–15.0)
MCHC: 32.9 g/dL (ref 30.0–36.0)
MCV: 84.4 fl (ref 78.0–100.0)
Platelets: 330 10*3/uL (ref 150.0–400.0)
RBC: 4.68 Mil/uL (ref 3.87–5.11)
RDW: 13.4 % (ref 11.5–15.5)
WBC: 6.4 10*3/uL (ref 4.0–10.5)

## 2015-11-01 LAB — H. PYLORI ANTIBODY, IGG: H PYLORI IGG: NEGATIVE

## 2015-11-01 LAB — LIPASE: LIPASE: 10 U/L — AB (ref 11.0–59.0)

## 2015-11-02 ENCOUNTER — Telehealth: Payer: Self-pay | Admitting: Family Medicine

## 2015-11-02 NOTE — Telephone Encounter (Signed)
Pt called to follow up on her lab results from 10/31/2015. Call pt @ [phone]. Thank you!

## 2015-11-03 NOTE — Telephone Encounter (Signed)
Pt is requesting lab results. Please advise, thanks

## 2015-11-03 NOTE — Telephone Encounter (Signed)
Notified pt of test results pt verbalized understanding and appreciation.

## 2015-11-03 NOTE — Telephone Encounter (Signed)
Patient's lab work was unremarkable. No apparent cause for her stomach discomfort. She should continue with the plan of care that we discussed.

## 2015-11-04 ENCOUNTER — Ambulatory Visit: Payer: 59 | Admitting: Nurse Practitioner

## 2015-12-01 ENCOUNTER — Ambulatory Visit: Payer: 59 | Admitting: Family Medicine

## 2015-12-12 ENCOUNTER — Ambulatory Visit: Payer: 59 | Admitting: Family Medicine

## 2015-12-12 DIAGNOSIS — Z0289 Encounter for other administrative examinations: Secondary | ICD-10-CM

## 2016-05-31 ENCOUNTER — Ambulatory Visit: Payer: 59 | Admitting: Family Medicine

## 2016-05-31 DIAGNOSIS — Z0289 Encounter for other administrative examinations: Secondary | ICD-10-CM

## 2016-10-05 ENCOUNTER — Ambulatory Visit: Payer: 59 | Admitting: Family Medicine

## 2017-06-02 ENCOUNTER — Emergency Department
Admission: EM | Admit: 2017-06-02 | Discharge: 2017-06-02 | Disposition: A | Discharge status: Home / Self Care | Payer: Self-pay | Attending: Emergency Medicine | Admitting: Emergency Medicine

## 2017-06-02 ENCOUNTER — Encounter: Payer: Self-pay | Admitting: Emergency Medicine

## 2017-06-02 ENCOUNTER — Emergency Department: Payer: Self-pay

## 2017-06-02 DIAGNOSIS — Z5321 Procedure and treatment not carried out due to patient leaving prior to being seen by health care provider: Secondary | ICD-10-CM | POA: Insufficient documentation

## 2017-06-02 DIAGNOSIS — K219 Gastro-esophageal reflux disease without esophagitis: Secondary | ICD-10-CM | POA: Insufficient documentation

## 2017-06-02 NOTE — ED Triage Notes (Addendum)
Patient has presented with "coughing up blood" since last night. Patient states she has chronic nasal drainage. Ambulatory to triage, NAD noted. Dry cough noted in triage, non-productive.

## 2017-08-06 ENCOUNTER — Encounter: Payer: 59 | Admitting: Family Medicine

## 2017-10-09 ENCOUNTER — Ambulatory Visit
Admission: EM | Admit: 2017-10-09 | Discharge: 2017-10-09 | Disposition: A | Discharge status: Home / Self Care | Payer: BLUE CROSS/BLUE SHIELD | Attending: Family Medicine | Admitting: Family Medicine

## 2017-10-09 ENCOUNTER — Encounter: Payer: Self-pay | Admitting: Emergency Medicine

## 2017-10-09 ENCOUNTER — Other Ambulatory Visit: Payer: Self-pay

## 2017-10-09 DIAGNOSIS — R42 Dizziness and giddiness: Secondary | ICD-10-CM

## 2017-10-09 DIAGNOSIS — J0110 Acute frontal sinusitis, unspecified: Secondary | ICD-10-CM | POA: Diagnosis not present

## 2017-10-09 DIAGNOSIS — J0100 Acute maxillary sinusitis, unspecified: Secondary | ICD-10-CM

## 2017-10-09 LAB — BASIC METABOLIC PANEL
Anion gap: 8 (ref 5–15)
BUN: 13 mg/dL (ref 6–20)
CALCIUM: 8.6 mg/dL — AB (ref 8.9–10.3)
CO2: 25 mmol/L (ref 22–32)
Chloride: 101 mmol/L (ref 101–111)
Creatinine, Ser: 0.72 mg/dL (ref 0.44–1.00)
GFR calc Af Amer: >60 mL/min (ref >60)
GLUCOSE: 97 mg/dL (ref 65–99)
Potassium: 3.8 mmol/L (ref 3.5–5.1)
Sodium: 134 mmol/L — ABNORMAL LOW (ref 135–145)

## 2017-10-09 MED ORDER — AMOXICILLIN-POT CLAVULANATE 875-125 MG PO TABS: 1.0000 | ORAL_TABLET | Freq: Two times a day (BID) | ORAL | 0 refills | Status: DC

## 2017-10-09 NOTE — ED Provider Notes (Signed)
[facility] ____________________________________________  Time seen: Approximately 1:55 PM  I have reviewed the triage vital signs and the nursing notes.   HISTORY  Chief Complaint Dizziness  HPI Mary Carpenter is a 37 y.o. female presenting for evaluation of dizzy spell that occurred while she was at work today.  Patient reports she does a lot of moving around and walking while at work, but reports she was mostly seen and still at that time.  States that she felt somewhat off balance and staggered for 1 step and then got her balance back.  Did not fall, denies near syncope or actual syncope.  Denies accompanying vision changes, unilateral weakness, paresthesias, room spinning sensation, trauma or movement trigger.  Patient reports currently feeling better.  States that she was seen by her gynecologist recently for her annual exam, and it was found that she does have diabetes, vitamin D deficiency, as well as borderline hypertension, but was referred to her primary for management.  States that she has a primary appointment this Friday.  Has not started any new medications.  Denies any recent vomiting, diarrhea, cough, fevers.  Patient does report for the last 3 weeks she has been dealing with sinus pressure and sinus drainage.  States that she has a history of this in the past.  States that she thought she had she could just support with some occasional over-the-counter medication and it would resolve, but reports it has not.  States area is more clogged and not able to get a lot of the drainage out.  Some congestion in ears as well.  No over-the-counter medications taken today prior to arrival. Denies chest pain, shortness of breath, abdominal pain, dysuria, extremity pain, extremity swelling or rash. Denies recent sickness. Denies recent antibiotic use.   Patient's last menstrual period was 09/16/2017 (approximate).Denies pregnancy.    Past Medical History:  Diagnosis Date  .  Allergic rhinitis   . Chicken pox   . Depression   . HPV (human papilloma virus) infection   . Hyperlipidemia   . Hypertension     Patient Active Problem List   Diagnosis Date Noted  . GERD (gastroesophageal reflux disease) 10/31/2015  . Constipation 10/31/2015  . Atypical chest pain 10/31/2015  . Allergic rhinitis 10/31/2015  . Anxiety and depression 10/31/2015  . Elevated blood pressure (not hypertension) 10/31/2015    History reviewed. No pertinent surgical history.   No current facility-administered medications for this encounter.   Current Outpatient Medications:  .  amoxicillin-clavulanate (AUGMENTIN) 875-125 MG tablet, Take 1 tablet by mouth every 12 (twelve) hours., Disp: 20 tablet, Rfl: 0  Allergies Patient has no known allergies.  Family History  Problem Relation Age of Onset  . Diabetes Mother   . Mental illness Mother   . Hypertension Mother   . Hypertension Father   . Hypertension Maternal Grandmother   . Diabetes Maternal Grandmother   . Diabetes Paternal Grandmother   . Hypertension Paternal Grandmother     Social History Social History   Tobacco Use  . Smoking status: Former Smoker    Last attempt to quit: 10/31/2014    Years since quitting: 2.9  . Smokeless tobacco: Never Used  Substance Use Topics  . Alcohol use: Yes    Alcohol/week: 0.0 - 1.2 oz  . Drug use: No    Review of Systems Constitutional: No fever/chills Eyes: No visual changes. ENT: No sore throat. As above.  Cardiovascular: Denies chest pain. Respiratory: Denies shortness of breath. Gastrointestinal: No abdominal  pain.  No nausea, no vomiting.  No diarrhea.   Genitourinary: Negative for dysuria. Musculoskeletal: Negative for back pain. Skin: Negative for rash. Neurological: Negative for focal weakness or numbness.   ____________________________________________   PHYSICAL EXAM:  VITAL SIGNS: ED Triage Vitals  Enc Vitals Group     BP 10/09/17 1303 137/89     Pulse  Rate 10/09/17 1303 75     Resp 10/09/17 1303 16     Temp 10/09/17 1303 97.7 F (36.5 C)     Temp Source 10/09/17 1303 Oral     SpO2 10/09/17 1303 99 %     Weight 10/09/17 1301 190 lb (86.2 kg)     Height 10/09/17 1301 5\' 8"  (1.727 m)     Head Circumference --      Peak Flow --      Pain Score 10/09/17 1300 5     Pain Loc --      Pain Edu? --      Excl. in GC? --     Constitutional: Alert and oriented. Well appearing and in no acute distress. Eyes: Conjunctivae are normal. PERRL. EOMI. Head: Atraumatic.Mild to moderate tenderness to palpation bilateral frontal and maxillary sinuses. No swelling. No erythema.   Ears: no erythema, normal TMs bilaterally.   Nose: nasal congestion with bilateral nasal turbinate erythema and edema.   Mouth/Throat: Mucous membranes are moist.  Oropharynx non-erythematous.No tonsillar swelling or exudate.  Neck: No stridor.  No cervical spine tenderness to palpation. Hematological/Lymphatic/Immunilogical: No cervical lymphadenopathy. Cardiovascular: Normal rate, regular rhythm. Grossly normal heart sounds.  Good peripheral circulation. Respiratory: Normal respiratory effort.  No retractions.  No wheezes, rales or rhonchi. Good air movement.  Gastrointestinal: Soft and nontender.  Musculoskeletal: No cervical, thoracic or lumbar tenderness to palpation.  Neurologic:  Normal speech and language. No gross focal neurologic deficits are appreciated. No gait instability.  Negative Romberg.  No ataxia.  No paresthesias.5/5 to bilateral upper and lower extremities. Skin:  Skin is warm, dry and intact. No rash noted. Psychiatric: Mood and affect are normal. Speech and behavior are normal.  ___________________________________________   LABS (all labs ordered are listed, but only abnormal results are displayed)  Labs Reviewed  BASIC METABOLIC PANEL - Abnormal; Notable for the following components:      Result Value   Sodium 134 (*)    Calcium 8.6 (*)    All  other components within normal limits     PROCEDURES Procedures   INITIAL IMPRESSION / ASSESSMENT AND PLAN / ED COURSE  Pertinent labs & imaging results that were available during my care of the patient were reviewed by me and considered in my medical decision making (see chart for details).  Well-appearing patient.  No acute distress.  Suspect sinusitis.  Discussed in detail with patient and reviewed labs from patient's most recent gynecological visit from 10/04/2017, reviewed CBC, TSH, diabetes labs.  No recent metabolic panel, BMP reviewed today and overall unremarkable.  Discussed these results with patient.  Discussed exam reassuring and patient feeling better, will treat for sinusitis with Augmentin.  Discussed over-the-counter decongestant, fluids and supportive care.  Follow-up with primary this week as scheduled.Discussed indication, risks and benefits of medications with patient.  Discussed follow up with Primary care physician this week. Discussed follow up and return parameters including no resolution or any worsening concerns. Patient verbalized understanding and agreed to plan.   ____________________________________________   FINAL CLINICAL IMPRESSION(S) / ED DIAGNOSES  Final diagnoses:  Acute frontal sinusitis, recurrence not  specified  Acute maxillary sinusitis, recurrence not specified  Dizziness     ED Discharge Orders        Ordered    amoxicillin-clavulanate (AUGMENTIN) 875-125 MG tablet  Every 12 hours     10/09/17 1451       Note: This dictation was prepared with Dragon dictation along with smaller phrase technology. Any transcriptional errors that result from this process are unintentional.         Renford Dills, NP 10/09/17 1536

## 2017-10-09 NOTE — ED Triage Notes (Signed)
Patient c/o having a dizzy spell when she was at work today.  Patient denies fall.  Patient states that at work she had her sugar check and was 150.  Patient states that she was recently diagnosed with Diabetes and Vitamin D Deficiency.

## 2017-10-09 NOTE — Discharge Instructions (Signed)
Take medication as prescribed. Rest. Drink plenty of fluids.  ° °Follow up with your primary care physician this week as needed. Return to Urgent care for new or worsening concerns.  ° °

## 2017-10-11 ENCOUNTER — Encounter: Payer: Self-pay | Admitting: Family Medicine

## 2017-10-22 ENCOUNTER — Ambulatory Visit: Payer: BLUE CROSS/BLUE SHIELD | Admitting: Dietician

## 2017-11-28 ENCOUNTER — Encounter: Payer: Self-pay | Admitting: Family Medicine

## 2017-11-28 DIAGNOSIS — Z0289 Encounter for other administrative examinations: Secondary | ICD-10-CM

## 2017-12-11 ENCOUNTER — Ambulatory Visit
Admission: EM | Admit: 2017-12-11 | Discharge: 2017-12-11 | Disposition: A | Discharge status: Home / Self Care | Payer: Self-pay | Attending: Family Medicine | Admitting: Family Medicine

## 2017-12-11 ENCOUNTER — Encounter: Payer: Self-pay | Admitting: *Deleted

## 2017-12-11 DIAGNOSIS — B370 Candidal stomatitis: Secondary | ICD-10-CM

## 2017-12-11 MED ORDER — NYSTATIN 100000 UNIT/ML MT SUSP: 500000.0000 [IU] | Freq: Four times a day (QID) | OROMUCOSAL | 0 refills | Status: AC

## 2017-12-11 NOTE — ED Triage Notes (Signed)
Sore throat and ear pressure x several days.

## 2017-12-11 NOTE — ED Provider Notes (Signed)
[facility]    CSN: [id number] Arrival date & time: 12/11/17  1735     History   Chief Complaint Chief Complaint  Patient presents with  . Sore Throat  . Otalgia    HPI Mary Carpenter is a 37 y.o. female.   HPI  37 year old female presents with throat and ear pressure that she had last week.  She took  Cough  syrup that she had from a previous visit and also took several days of Augmentin that she had left over from a previous visit.  She became concerned because her lips on the inner surface became very pink as did her posterior  pharynx.  Has had a very thick white coating of her tongue that tastes bad.  She has wiped it off her tongue and the roof of her mouth several times today.. No fever or chills.  Her previous cold symptoms have resolved.  Claims it is worse in the mornings.          Past Medical History:  Diagnosis Date  . Allergic rhinitis   . Chicken pox   . Depression   . HPV (human papilloma virus) infection   . Hyperlipidemia   . Hypertension     Patient Active Problem List   Diagnosis Date Noted  . GERD (gastroesophageal reflux disease) 10/31/2015  . Constipation 10/31/2015  . Atypical chest pain 10/31/2015  . Allergic rhinitis 10/31/2015  . Anxiety and depression 10/31/2015  . Elevated blood pressure (not hypertension) 10/31/2015    History reviewed. No pertinent surgical history.  OB History   None      Home Medications    Prior to Admission medications   Medication Sig Start Date End Date Taking? Authorizing Provider  nystatin (MYCOSTATIN) 100000 UNIT/ML suspension Take 5 mLs (500,000 Units total) by mouth 4 (four) times daily for 10 days. Retain in mouth as long as possible 12/11/17 12/21/17  Lutricia Feiloemer, William P, PA-C    Family History Family History  Problem Relation Age of Onset  . Diabetes Mother   . Mental illness Mother   . Hypertension Mother   . Hypertension Father   . Hypertension Maternal Grandmother   .  Diabetes Maternal Grandmother   . Diabetes Paternal Grandmother   . Hypertension Paternal Grandmother     Social History Social History   Tobacco Use  . Smoking status: Former Smoker    Last attempt to quit: 10/31/2014    Years since quitting: 3.1  . Smokeless tobacco: Never Used  Substance Use Topics  . Alcohol use: Yes    Alcohol/week: 0.0 - 1.2 oz  . Drug use: No     Allergies   Patient has no known allergies.   Review of Systems Review of Systems  Constitutional: Positive for activity change. Negative for chills, fatigue and fever.  HENT: Positive for congestion, mouth sores and sore throat.   All other systems reviewed and are negative.    Physical Exam Triage Vital Signs ED Triage Vitals  Enc Vitals Group     BP 12/11/17 1754 (!) 154/87     Pulse Rate 12/11/17 1754 80     Resp 12/11/17 1754 16     Temp 12/11/17 1754 98.1 F (36.7 C)     Temp Source 12/11/17 1754 Oral     SpO2 12/11/17 1754 100 %     Weight 12/11/17 1756 190 lb (86.2 kg)     Height 12/11/17 1756 5\' 8"  (1.727 m)  Head Circumference --      Peak Flow --      Pain Score 12/11/17 1756 0     Pain Loc --      Pain Edu? --      Excl. in GC? --    No data found.  Updated Vital Signs BP (!) 154/87 (BP Location: Left Arm)   Pulse 80   Temp 98.1 F (36.7 C) (Oral)   Resp 16   Ht 5\' 8"  (1.727 m)   Wt 190 lb (86.2 kg)   SpO2 100%   BMI 28.89 kg/m   Visual Acuity Right Eye Distance:   Left Eye Distance:   Bilateral Distance:    Right Eye Near:   Left Eye Near:    Bilateral Near:     Physical Exam  Constitutional: She is oriented to person, place, and time. She appears well-developed and well-nourished.  Non-toxic appearance. She does not appear ill. No distress.  HENT:  Head: Normocephalic.  Right Ear: Hearing, tympanic membrane and ear canal normal.  Left Ear: Hearing, tympanic membrane and ear canal normal.  Mouth/Throat: Uvula is midline and oropharynx is clear and moist.  Oral lesions present.  Eyes: Pupils are equal, round, and reactive to light.  Neck: Normal range of motion. Neck supple.  Pulmonary/Chest: Effort normal and breath sounds normal.  Neurological: She is alert and oriented to person, place, and time.  Skin: Skin is warm and dry.  Psychiatric: She has a normal mood and affect. Her behavior is normal.  Nursing note and vitals reviewed.    UC Treatments / Results  Labs (all labs ordered are listed, but only abnormal results are displayed) Labs Reviewed - No data to display  EKG None Radiology No results found.  Procedures Procedures (including critical care time)  Medications Ordered in UC Medications - No data to display   Initial Impression / Assessment and Plan / UC Course  I have reviewed the triage vital signs and the nursing notes.  Pertinent labs & imaging results that were available during my care of the patient were reviewed by me and considered in my medical decision making (see chart for details).     Plan: 1. Test/x-ray results and diagnosis reviewed with patient 2. rx as per orders; risks, benefits, potential side effects reviewed with patient 3. Recommend supportive treatment with force fluids.  Treat with nystatin swish and swallow.  Her examination today is not conclusive but her story and symptoms that she has had prior to the wiping of her oropharynx is assistant with oral thrush.  She is also taken antibiotics for short course prior to the onset of her symptoms.  She is not improving I recommended she follow-up with her primary care physician Dr. Birdie Sons 4. F/u prn if symptoms worsen or don't improve   Final Clinical Impressions(s) / UC Diagnoses   Final diagnoses:  Thrush, oral    ED Discharge Orders        Ordered    nystatin (MYCOSTATIN) 100000 UNIT/ML suspension  4 times daily     12/11/17 1842       Controlled Substance Prescriptions [place] Controlled Substance Registry consulted? Not  Applicable   Lutricia Feil, PA-C 12/11/17 1610

## 2018-04-14 ENCOUNTER — Other Ambulatory Visit: Payer: Self-pay

## 2018-04-14 ENCOUNTER — Encounter: Payer: Self-pay | Admitting: Emergency Medicine

## 2018-04-14 ENCOUNTER — Emergency Department
Admission: EM | Admit: 2018-04-14 | Discharge: 2018-04-14 | Disposition: A | Discharge status: Home / Self Care | Payer: Managed Care, Other (non HMO) | Attending: Emergency Medicine | Admitting: Emergency Medicine

## 2018-04-14 DIAGNOSIS — M5442 Lumbago with sciatica, left side: Secondary | ICD-10-CM | POA: Insufficient documentation

## 2018-04-14 DIAGNOSIS — E785 Hyperlipidemia, unspecified: Secondary | ICD-10-CM | POA: Insufficient documentation

## 2018-04-14 DIAGNOSIS — Z87891 Personal history of nicotine dependence: Secondary | ICD-10-CM | POA: Diagnosis not present

## 2018-04-14 DIAGNOSIS — M5441 Lumbago with sciatica, right side: Secondary | ICD-10-CM | POA: Insufficient documentation

## 2018-04-14 DIAGNOSIS — M545 Low back pain: Secondary | ICD-10-CM | POA: Diagnosis present

## 2018-04-14 DIAGNOSIS — I10 Essential (primary) hypertension: Secondary | ICD-10-CM | POA: Insufficient documentation

## 2018-04-14 LAB — URINALYSIS, COMPLETE (UACMP) WITH MICROSCOPIC
Bacteria, UA: NONE SEEN
Bilirubin Urine: NEGATIVE
Glucose, UA: NEGATIVE mg/dL
Hgb urine dipstick: NEGATIVE
Ketones, ur: 5 mg/dL — AB
Leukocytes, UA: NEGATIVE
Nitrite: NEGATIVE
PH: 5 (ref 5.0–8.0)
Protein, ur: 30 mg/dL — AB
SPECIFIC GRAVITY, URINE: 1.034 — AB (ref 1.005–1.030)

## 2018-04-14 LAB — POCT PREGNANCY, URINE: Preg Test, Ur: NEGATIVE

## 2018-04-14 MED ORDER — MELOXICAM 15 MG PO TABS: 15.0000 mg | ORAL_TABLET | Freq: Every day | ORAL | 0 refills | Status: DC

## 2018-04-14 MED ORDER — METHOCARBAMOL 500 MG PO TABS: 500.0000 mg | ORAL_TABLET | Freq: Four times a day (QID) | ORAL | 0 refills | Status: DC

## 2018-04-14 NOTE — ED Triage Notes (Signed)
C/O low back pain x 3 days.  Denies injury.

## 2018-04-14 NOTE — ED Provider Notes (Signed)
[facility] Emergency Department Provider Note  ____________________________________________  Time seen: Approximately 3:52 PM  I have reviewed the triage vital signs and the nursing notes.   HISTORY  Chief Complaint Back Pain    HPI Mary Carpenter is a 37 y.o. female who presents the emergency department complaining of lower back pain radiating into bilateral hips.   Patient reports that she has had some lower back pain over the past several days.  She does have a history of scoliosis and states that intermittently she will have back pain.  She typically overlooks same but this has been worse than normal, lasting longer than normal.  Patient reports that pain radiates up to bilateral hips, occasionally down bilateral legs.  She reports is a tight/burning sensation.  Patient denies any recent trauma.  She denies any urinary symptoms to include dysuria, polyuria, hematuria.  No GI complaints of abdominal pain, diarrhea or constipation.  No other complaints at this time.   Past Medical History:  Diagnosis Date  . Allergic rhinitis   . Chicken pox   . Depression   . HPV (human papilloma virus) infection   . Hyperlipidemia   . Hypertension     Patient Active Problem List   Diagnosis Date Noted  . GERD (gastroesophageal reflux disease) 10/31/2015  . Constipation 10/31/2015  . Atypical chest pain 10/31/2015  . Allergic rhinitis 10/31/2015  . Anxiety and depression 10/31/2015  . Elevated blood pressure (not hypertension) 10/31/2015    History reviewed. No pertinent surgical history.  Prior to Admission medications   Medication Sig Start Date End Date Taking? Authorizing Provider  meloxicam (MOBIC) 15 MG tablet Take 1 tablet (15 mg total) by mouth daily. 04/14/18   Arriona Prest, Delorise Royals, PA-C  methocarbamol (ROBAXIN) 500 MG tablet Take 1 tablet (500 mg total) by mouth 4 (four) times daily. 04/14/18   Keilin Gamboa, Delorise Royals, PA-C    Allergies Patient has  no known allergies.  Family History  Problem Relation Age of Onset  . Diabetes Mother   . Mental illness Mother   . Hypertension Mother   . Hypertension Father   . Hypertension Maternal Grandmother   . Diabetes Maternal Grandmother   . Diabetes Paternal Grandmother   . Hypertension Paternal Grandmother     Social History Social History   Tobacco Use  . Smoking status: Former Smoker    Last attempt to quit: 10/31/2014    Years since quitting: 3.4  . Smokeless tobacco: Never Used  Substance Use Topics  . Alcohol use: Yes    Alcohol/week: 0.0 - 2.0 standard drinks  . Drug use: No     Review of Systems  Constitutional: No fever/chills Eyes: No visual changes. Cardiovascular: no chest pain. Respiratory: no cough. No SOB. Gastrointestinal: No abdominal pain.  No nausea, no vomiting. No diarrhea.  No constipation. Genitourinary: Negative for dysuria. No hematuria Musculoskeletal: Positive for lower back pain radiating into bilateral hips Skin: Negative for rash, abrasions, lacerations, ecchymosis. Neurological: Negative for headaches, focal weakness or numbness. 10-point ROS otherwise negative.  ____________________________________________   PHYSICAL EXAM:  VITAL SIGNS: ED Triage Vitals  Enc Vitals Group     BP 04/14/18 1444 139/86     Pulse Rate 04/14/18 1444 92     Resp 04/14/18 1444 18     Temp 04/14/18 1444 98.4 F (36.9 C)     Temp Source 04/14/18 1444 Oral     SpO2 04/14/18 1444 98 %     Weight 04/14/18 1414  190 lb 0.6 oz (86.2 kg)     Height 04/14/18 1445 5\' 8"  (1.727 m)     Head Circumference --      Peak Flow --      Pain Score 04/14/18 1414 5     Pain Loc --      Pain Edu? --      Excl. in GC? --      Constitutional: Alert and oriented. Well appearing and in no acute distress. Eyes: Conjunctivae are normal. PERRL. EOMI. Head: Atraumatic. Neck: No stridor.    Cardiovascular: Normal rate, regular rhythm. Normal S1 and S2.  Good peripheral  circulation. Respiratory: Normal respiratory effort without tachypnea or retractions. Lungs CTAB. Good air entry to the bases with no decreased or absent breath sounds. Gastrointestinal: Bowel sounds 4 quadrants. Soft and nontender to palpation. No guarding or rigidity. No palpable masses. No distention. No CVA tenderness. Musculoskeletal: Full range of motion to all extremities. No gross deformities appreciated.  Visualization of the lumbar spine reveals no gross deformity.  Mild midline tenderness, worse on the right paraspinal muscle group.  Patient is tender to palpation over right-sided sciatic notch.  Nontender to palpation or left-sided sciatic notch.  Negative straight leg raise bilaterally.  Dorsalis pedis pulse intact bilateral lower extremities.  Sensation intact and equal bilateral lower extremities. Neurologic:  Normal speech and language. No gross focal neurologic deficits are appreciated.  Skin:  Skin is warm, dry and intact. No rash noted. Psychiatric: Mood and affect are normal. Speech and behavior are normal. Patient exhibits appropriate insight and judgement.   ____________________________________________   LABS (all labs ordered are listed, but only abnormal results are displayed)  Labs Reviewed  URINALYSIS, COMPLETE (UACMP) WITH MICROSCOPIC - Abnormal; Notable for the following components:      Result Value   Color, Urine YELLOW (*)    APPearance CLEAR (*)    Specific Gravity, Urine 1.034 (*)    Ketones, ur 5 (*)    Protein, ur 30 (*)    All other components within normal limits  POC URINE PREG, ED  POCT PREGNANCY, URINE   ____________________________________________  EKG   ____________________________________________  RADIOLOGY   No results found.  ____________________________________________    PROCEDURES  Procedure(s) performed:    Procedures    Medications - No data to display   ____________________________________________   INITIAL  IMPRESSION / ASSESSMENT AND PLAN / ED COURSE  Pertinent labs & imaging results that were available during my care of the patient were reviewed by me and considered in my medical decision making (see chart for details).  Review of the [place] CSRS was performed in accordance of the [facility] prior to dispensing any controlled drugs.      Patient's diagnosis is consistent with lower back pain with sciatica.  Patient presents emergency department with increasing lower back pain.  She sits for long periods of time.  Findings are consistent with lumbago with intermittent bilateral sciatica.. Patient will be discharged home with prescriptions for meloxicam and Robaxin. Patient is to follow up with primary care as needed or otherwise directed. Patient is given ED precautions to return to the ED for any worsening or new symptoms.     ____________________________________________  FINAL CLINICAL IMPRESSION(S) / ED DIAGNOSES  Final diagnoses:  Acute midline low back pain with bilateral sciatica      NEW MEDICATIONS STARTED DURING THIS VISIT:  ED Discharge Orders         Ordered    meloxicam (MOBIC) 15  MG tablet  Daily     04/14/18 1555    methocarbamol (ROBAXIN) 500 MG tablet  4 times daily     04/14/18 1555              This chart was dictated using voice recognition software/Dragon. Despite best efforts to proofread, errors can occur which can change the meaning. Any change was purely unintentional.    Racheal PatchesCuthriell, Kemarion Abbey D, PA-C 04/14/18 1616    Phineas SemenGoodman, [place], MD 04/14/18 2026

## 2018-04-14 NOTE — ED Notes (Signed)
See triage note  Presents with lower back pain for couple of days  States pain moves into bilateral groin ares  Denies any trauma or urinary sx's  Ambulates well to treatment room

## 2018-07-30 ENCOUNTER — Other Ambulatory Visit: Payer: Self-pay

## 2018-07-30 ENCOUNTER — Emergency Department
Admission: EM | Admit: 2018-07-30 | Discharge: 2018-07-30 | Disposition: A | Discharge status: Home / Self Care | Payer: Commercial Managed Care - PPO | Attending: Emergency Medicine | Admitting: Emergency Medicine

## 2018-07-30 ENCOUNTER — Encounter: Payer: Self-pay | Admitting: Emergency Medicine

## 2018-07-30 DIAGNOSIS — I10 Essential (primary) hypertension: Secondary | ICD-10-CM | POA: Diagnosis not present

## 2018-07-30 DIAGNOSIS — R1012 Left upper quadrant pain: Secondary | ICD-10-CM | POA: Insufficient documentation

## 2018-07-30 DIAGNOSIS — R1013 Epigastric pain: Secondary | ICD-10-CM | POA: Diagnosis present

## 2018-07-30 DIAGNOSIS — Z79899 Other long term (current) drug therapy: Secondary | ICD-10-CM | POA: Diagnosis not present

## 2018-07-30 DIAGNOSIS — R1010 Upper abdominal pain, unspecified: Secondary | ICD-10-CM

## 2018-07-30 DIAGNOSIS — Z87891 Personal history of nicotine dependence: Secondary | ICD-10-CM | POA: Insufficient documentation

## 2018-07-30 DIAGNOSIS — R140 Abdominal distension (gaseous): Secondary | ICD-10-CM | POA: Diagnosis not present

## 2018-07-30 DIAGNOSIS — R12 Heartburn: Secondary | ICD-10-CM | POA: Diagnosis not present

## 2018-07-30 LAB — COMPREHENSIVE METABOLIC PANEL
ALBUMIN: 4 g/dL (ref 3.5–5.0)
ALT: 26 U/L (ref 0–44)
AST: 22 U/L (ref 15–41)
Alkaline Phosphatase: 57 U/L (ref 38–126)
Anion gap: 9 (ref 5–15)
BUN: 11 mg/dL (ref 6–20)
CHLORIDE: 105 mmol/L (ref 98–111)
CO2: 26 mmol/L (ref 22–32)
CREATININE: 0.67 mg/dL (ref 0.44–1.00)
Calcium: 8.8 mg/dL — ABNORMAL LOW (ref 8.9–10.3)
GFR calc Af Amer: >60 mL/min (ref >60)
GLUCOSE: 120 mg/dL — AB (ref 70–99)
POTASSIUM: 3.6 mmol/L (ref 3.5–5.1)
Sodium: 140 mmol/L (ref 135–145)
Total Bilirubin: 0.4 mg/dL (ref 0.3–1.2)
Total Protein: 7.4 g/dL (ref 6.5–8.1)

## 2018-07-30 LAB — URINALYSIS, COMPLETE (UACMP) WITH MICROSCOPIC
BACTERIA UA: NONE SEEN
BILIRUBIN URINE: NEGATIVE
GLUCOSE, UA: NEGATIVE mg/dL
Hgb urine dipstick: NEGATIVE
Ketones, ur: NEGATIVE mg/dL
Nitrite: NEGATIVE
PH: 7 (ref 5.0–8.0)
Protein, ur: NEGATIVE mg/dL
Specific Gravity, Urine: 1.023 (ref 1.005–1.030)

## 2018-07-30 LAB — CBC
HEMATOCRIT: 41.4 % (ref 36.0–46.0)
HEMOGLOBIN: 13.6 g/dL (ref 12.0–15.0)
MCH: 28 pg (ref 26.0–34.0)
MCHC: 32.9 g/dL (ref 30.0–36.0)
MCV: 85.2 fL (ref 80.0–100.0)
Platelets: 297 10*3/uL (ref 150–400)
RBC: 4.86 MIL/uL (ref 3.87–5.11)
RDW: 12.8 % (ref 11.5–15.5)
WBC: 7.9 10*3/uL (ref 4.0–10.5)
nRBC: 0 % (ref 0.0–0.2)

## 2018-07-30 LAB — PREGNANCY, URINE: Preg Test, Ur: NEGATIVE

## 2018-07-30 LAB — LIPASE, BLOOD: LIPASE: 28 U/L (ref 11–51)

## 2018-07-30 MED ORDER — PANTOPRAZOLE SODIUM 40 MG PO TBEC: 40.0000 mg | DELAYED_RELEASE_TABLET | Freq: Every day | ORAL | 1 refills | Status: DC

## 2018-07-30 NOTE — ED Provider Notes (Addendum)
[facility] Emergency Department Provider Note  Time seen: 4:59 PM  I have reviewed the triage vital signs and the nursing notes.   HISTORY  Chief Complaint Abdominal Pain    HPI Mary Carpenter is a 37 y.o. female with a past medical history of depression, hypertension, hyperlipidemia presents to the emergency department for abdominal discomfort.  According to the patient for the past 6 days she has been experiencing bloating in her abdomen, along with abdominal discomfort in the epigastrium the left upper quadrant.  States it is somewhat worse after eating.  States her heartburn is worse as well.  States a mild amount of constipation denies any diarrhea or fever.  Denies any vomiting.  Patient states she has had similar symptoms ongoing for many months but they have been much more constant over the past 1 week.   Past Medical History:  Diagnosis Date  . Allergic rhinitis   . Chicken pox   . Depression   . HPV (human papilloma virus) infection   . Hyperlipidemia   . Hypertension     Patient Active Problem List   Diagnosis Date Noted  . GERD (gastroesophageal reflux disease) 10/31/2015  . Constipation 10/31/2015  . Atypical chest pain 10/31/2015  . Allergic rhinitis 10/31/2015  . Anxiety and depression 10/31/2015  . Elevated blood pressure (not hypertension) 10/31/2015    History reviewed. No pertinent surgical history.  Prior to Admission medications   Medication Sig Start Date End Date Taking? Authorizing Provider  meloxicam (MOBIC) 15 MG tablet Take 1 tablet (15 mg total) by mouth daily. 04/14/18   Cuthriell, Delorise RoyalsJonathan D, PA-C  methocarbamol (ROBAXIN) 500 MG tablet Take 1 tablet (500 mg total) by mouth 4 (four) times daily. 04/14/18   Cuthriell, Delorise RoyalsJonathan D, PA-C    No Known Allergies  Family History  Problem Relation Age of Onset  . Diabetes Mother   . Mental illness Mother   . Hypertension Mother   . Hypertension Father   . Hypertension  Maternal Grandmother   . Diabetes Maternal Grandmother   . Diabetes Paternal Grandmother   . Hypertension Paternal Grandmother     Social History Social History   Tobacco Use  . Smoking status: Former Smoker    Last attempt to quit: 10/31/2014    Years since quitting: 3.7  . Smokeless tobacco: Never Used  Substance Use Topics  . Alcohol use: Yes    Alcohol/week: 0.0 - 2.0 standard drinks  . Drug use: No    Review of Systems Constitutional: Negative for fever. Cardiovascular: Negative for chest pain. Respiratory: Negative for shortness of breath. Gastrointestinal: Epigastric and left upper quadrant abdominal discomfort.  Negative for vomiting or diarrhea.  Some constipation. Genitourinary: Negative for urinary compaints Musculoskeletal: Negative for musculoskeletal complaints Skin: Negative for skin complaints  Neurological: Negative for headache All other ROS negative  ____________________________________________   PHYSICAL EXAM:  VITAL SIGNS: ED Triage Vitals [07/30/18 1359]  Enc Vitals Group     BP (!) 147/87     Pulse Rate 83     Resp 18     Temp 98.3 F (36.8 C)     Temp Source Oral     SpO2 98 %     Weight 200 lb (90.7 kg)     Height 5\' 8"  (1.727 m)     Head Circumference      Peak Flow      Pain Score 8     Pain Loc  Pain Edu?      Excl. in GC?     Constitutional: Alert and oriented. Well appearing and in no distress. Eyes: Normal exam ENT   Head: Normocephalic and atraumatic.   Mouth/Throat: Mucous membranes are moist. Cardiovascular: Normal rate, regular rhythm. No murmur Respiratory: Normal respiratory effort without tachypnea nor retractions. Breath sounds are clear  Gastrointestinal: Soft, mild epigastric and left upper quadrant tenderness to palpation.  No rebound guarding or distention. Musculoskeletal: Nontender with normal range of motion in all extremities.  Neurologic:  Normal speech and language. No gross focal neurologic  deficits Skin:  Skin is warm, dry and intact.  Psychiatric: Mood and affect are normal.    INITIAL IMPRESSION / ASSESSMENT AND PLAN / ED COURSE  Pertinent labs & imaging results that were available during my care of the patient were reviewed by me and considered in my medical decision making (see chart for details).  Patient presents to the emergency department with several months of symptoms of epigastric and left upper quadrant distention especially after eating.  States her symptoms have become much more constant over the past 6 days ever since eating Thanksgiving dinner.  Patient has mild tenderness in the epigastrium and left upper quadrant, states pain when she eats, states significant heartburn as well which has gotten worse as well.  Patient's lab work is extremely reassuring, normal white blood cell count, normal LFTs and lipase.  Of note patient has no right upper quadrant tenderness whatsoever on exam.  Patient symptoms are very suggestive of gastritis.  We will place on a proton pump inhibitor I discussed with the patient using Maalox after each meal and right before going to sleep for the next 3 days.  I also discussed GI follow-up.  Patient agreeable to plan of care.  I discussed my normal abdominal pain return precautions for any fever or development of worsening abdominal pain.  ____________________________________________   FINAL CLINICAL IMPRESSION(S) / ED DIAGNOSES  Upper abdominal discomfort Gastritis    Minna Antis, MD 07/30/18 1702    Minna Antis, MD 07/30/18 [phone]

## 2018-07-30 NOTE — ED Notes (Signed)
Lab notified to add on urine pregnancy.  

## 2018-07-30 NOTE — ED Triage Notes (Signed)
PT arrives with complaints of bloating/cramping post eating. Pt states "it lasts for a few hours." Pt states these episodes have been prevalent since Thanksgiving.

## 2018-10-15 ENCOUNTER — Encounter: Payer: Commercial Managed Care - PPO | Admitting: Family Medicine

## 2018-11-08 ENCOUNTER — Other Ambulatory Visit: Payer: Self-pay

## 2018-11-08 ENCOUNTER — Encounter: Payer: Self-pay | Admitting: Emergency Medicine

## 2018-11-08 ENCOUNTER — Emergency Department: Payer: Commercial Managed Care - PPO

## 2018-11-08 ENCOUNTER — Emergency Department
Admission: EM | Admit: 2018-11-08 | Discharge: 2018-11-08 | Disposition: A | Discharge status: Home / Self Care | Payer: Commercial Managed Care - PPO | Attending: Emergency Medicine | Admitting: Emergency Medicine

## 2018-11-08 DIAGNOSIS — Z87891 Personal history of nicotine dependence: Secondary | ICD-10-CM | POA: Diagnosis not present

## 2018-11-08 DIAGNOSIS — R0981 Nasal congestion: Secondary | ICD-10-CM | POA: Diagnosis present

## 2018-11-08 DIAGNOSIS — I10 Essential (primary) hypertension: Secondary | ICD-10-CM | POA: Insufficient documentation

## 2018-11-08 DIAGNOSIS — Z79899 Other long term (current) drug therapy: Secondary | ICD-10-CM | POA: Insufficient documentation

## 2018-11-08 DIAGNOSIS — J019 Acute sinusitis, unspecified: Secondary | ICD-10-CM | POA: Diagnosis not present

## 2018-11-08 MED ORDER — FLUTICASONE PROPIONATE 50 MCG/ACT NA SUSP: 2.0000 | Freq: Every day | NASAL | 0 refills | Status: DC

## 2018-11-08 MED ORDER — CETIRIZINE HCL 10 MG PO TABS: 10.0000 mg | ORAL_TABLET | Freq: Every day | ORAL | 0 refills | Status: DC

## 2018-11-08 MED ORDER — ALBUTEROL SULFATE HFA 108 (90 BASE) MCG/ACT IN AERS: 2.0000 | INHALATION_SPRAY | Freq: Four times a day (QID) | RESPIRATORY_TRACT | 0 refills | Status: AC | PRN

## 2018-11-08 NOTE — ED Provider Notes (Signed)
[facility] Emergency Department Provider Note  ____________________________________________  Time seen: Approximately 8:25 AM  I have reviewed the triage vital signs and the nursing notes.   HISTORY  Chief Complaint URI    HPI Mary Carpenter is a 38 y.o. female that presents to the emergency department for the emergency department for evaluationof nasal congestion, cough for 1.5 weeks.  Patient has had postnasal drip.  Rhinorrhea is worse at night. She feels phlegm in her throat. This morning she felt a rattle in her chest and some tightness when she coughed and it cleared.  She has trouble with allergies.  No asthma.  She was supposed to go on a cruise this week but was turned away at the port because her mother has kidney disease so she drove to [place] and came home.  No other travel.  No contacts with coronavirus.  No facial pain, shortness of breath, chest pain, vomiting, diarrhea.   Past Medical History:  Diagnosis Date  . Allergic rhinitis   . Chicken pox   . Depression   . HPV (human papilloma virus) infection   . Hyperlipidemia   . Hypertension     Patient Active Problem List   Diagnosis Date Noted  . GERD (gastroesophageal reflux disease) 10/31/2015  . Constipation 10/31/2015  . Atypical chest pain 10/31/2015  . Allergic rhinitis 10/31/2015  . Anxiety and depression 10/31/2015  . Elevated blood pressure (not hypertension) 10/31/2015    History reviewed. No pertinent surgical history.  Prior to Admission medications   Medication Sig Start Date End Date Taking? Authorizing Provider  albuterol (PROVENTIL HFA;VENTOLIN HFA) 108 (90 Base) MCG/ACT inhaler Inhale 2 puffs into the lungs every 6 (six) hours as needed for wheezing or shortness of breath. 11/08/18   Enid Derry, PA-C  cetirizine (ZYRTEC ALLERGY) 10 MG tablet Take 1 tablet (10 mg total) by mouth daily. 11/08/18   Enid Derry, PA-C  fluticasone (FLONASE) 50 MCG/ACT nasal  spray Place 2 sprays into both nostrils daily. 11/08/18 11/08/19  Enid Derry, PA-C  meloxicam (MOBIC) 15 MG tablet Take 1 tablet (15 mg total) by mouth daily. 04/14/18   Cuthriell, Delorise Royals, PA-C  methocarbamol (ROBAXIN) 500 MG tablet Take 1 tablet (500 mg total) by mouth 4 (four) times daily. 04/14/18   Cuthriell, Delorise Royals, PA-C  pantoprazole (PROTONIX) 40 MG tablet Take 1 tablet (40 mg total) by mouth daily. 07/30/18 07/30/19  Minna Antis, MD    Allergies Patient has no known allergies.  Family History  Problem Relation Age of Onset  . Diabetes Mother   . Mental illness Mother   . Hypertension Mother   . Hypertension Father   . Hypertension Maternal Grandmother   . Diabetes Maternal Grandmother   . Diabetes Paternal Grandmother   . Hypertension Paternal Grandmother     Social History Social History   Tobacco Use  . Smoking status: Former Smoker    Last attempt to quit: 10/31/2014    Years since quitting: 4.0  . Smokeless tobacco: Never Used  Substance Use Topics  . Alcohol use: Yes    Alcohol/week: 0.0 - 2.0 standard drinks  . Drug use: No     Review of Systems  Constitutional: No fever/chills Eyes: No visual changes. No discharge. ENT: Positive for congestion and rhinorrhea. Cardiovascular: No chest pain. Respiratory: Positive for cough. No SOB. Gastrointestinal: No abdominal pain.  No nausea, no vomiting.  No diarrhea.  No constipation. Musculoskeletal: Negative for musculoskeletal pain. Skin: Negative for rash, abrasions,  lacerations, ecchymosis. Neurological: Negative for headaches.   ____________________________________________   PHYSICAL EXAM:  VITAL SIGNS: ED Triage Vitals  Enc Vitals Group     BP 11/08/18 0702 (!) 151/82     Pulse Rate 11/08/18 0702 76     Resp 11/08/18 0702 18     Temp 11/08/18 0702 98.6 F (37 C)     Temp Source 11/08/18 0702 Oral     SpO2 11/08/18 0702 99 %     Weight 11/08/18 0703 190 lb (86.2 kg)     Height  11/08/18 0703 5\' 8"  (1.727 m)     Head Circumference --      Peak Flow --      Pain Score 11/08/18 0703 0     Pain Loc --      Pain Edu? --      Excl. in GC? --      Constitutional: Alert and oriented. Well appearing and in no acute distress. Eyes: Conjunctivae are normal. PERRL. EOMI. No discharge. Head: Atraumatic. ENT: No frontal and maxillary sinus tenderness.      Ears: Tympanic membranes pearly gray with good landmarks. No discharge.      Nose: Mild congestion/rhinnorhea.      Mouth/Throat: Mucous membranes are moist. Oropharynx non-erythematous. Tonsils not enlarged. No exudates. Uvula midline. Neck: No stridor.   Hematological/Lymphatic/Immunilogical: No cervical lymphadenopathy. Cardiovascular: Normal rate, regular rhythm.  Good peripheral circulation. Respiratory: Normal respiratory effort without tachypnea or retractions. Lungs CTAB. Good air entry to the bases with no decreased or absent breath sounds. Gastrointestinal: Bowel sounds 4 quadrants. Soft and nontender to palpation. No guarding or rigidity. No palpable masses. No distention. Musculoskeletal: Full range of motion to all extremities. No gross deformities appreciated. Neurologic:  Normal speech and language. No gross focal neurologic deficits are appreciated.  Skin:  Skin is warm, dry and intact. No rash noted. Psychiatric: Mood and affect are normal. Speech and behavior are normal. Patient exhibits appropriate insight and judgement.   ____________________________________________   LABS (all labs ordered are listed, but only abnormal results are displayed)  Labs Reviewed - No data to display ____________________________________________  EKG   ____________________________________________  RADIOLOGY Lexine Baton, personally viewed and evaluated these images (plain radiographs) as part of my medical decision making, as well as reviewing the written report by the radiologist.  Dg Chest 2  View  Result Date: 11/08/2018 CLINICAL DATA:  38 year old female with cough and fever for 2 weeks. EXAM: CHEST - 2 VIEW COMPARISON:  06/02/2017 and prior radiographs FINDINGS: The cardiomediastinal silhouette is unremarkable. There is no evidence of focal airspace disease, pulmonary edema, suspicious pulmonary nodule/mass, pleural effusion, or pneumothorax. No acute bony abnormalities are identified. IMPRESSION: No active cardiopulmonary disease. Electronically Signed   By: Harmon Pier M.D.   On: 11/08/2018 07:47    ____________________________________________    PROCEDURES  Procedure(s) performed:    Procedures    Medications - No data to display   ____________________________________________   INITIAL IMPRESSION / ASSESSMENT AND PLAN / ED COURSE  Pertinent labs & imaging results that were available during my care of the patient were reviewed by me and considered in my medical decision making (see chart for details).  Review of the [place] CSRS was performed in accordance of the [facility] prior to dispensing any controlled drugs.   Patient's diagnosis is consistent with sinusitis. Vital signs and exam are reassuring. Patient appears well and is staying well hydrated.  Patient was given an albuterol inhaler for chest tightness  that she felt this morning.  Patient should alternate tylenol and ibuprofen for fever. Patient feels comfortable going home. Patient will be discharged home with prescriptions for Flonase, albuterol, and cetirizine. Patient is to follow up with PCP as needed or otherwise directed. Patient is given ED precautions to return to the ED for any worsening or new symptoms.     ____________________________________________  FINAL CLINICAL IMPRESSION(S) / ED DIAGNOSES  Final diagnoses:  Acute non-recurrent sinusitis, unspecified location      NEW MEDICATIONS STARTED DURING THIS VISIT:  ED Discharge Orders         Ordered    cetirizine (ZYRTEC ALLERGY) 10 MG tablet   Daily     11/08/18 0844    fluticasone (FLONASE) 50 MCG/ACT nasal spray  Daily     11/08/18 0844    albuterol (PROVENTIL HFA;VENTOLIN HFA) 108 (90 Base) MCG/ACT inhaler  Every 6 hours PRN     11/08/18 0844              This chart was dictated using voice recognition software/Dragon. Despite best efforts to proofread, errors can occur which can change the meaning. Any change was purely unintentional.    Enid Derry, PA-C 11/08/18 1334    Minna Antis, MD 11/08/18 [phone]

## 2018-11-08 NOTE — ED Triage Notes (Signed)
Pt arrives ambulatory to triage with c/o "rattling" in her chest x 2 weeks and this morning she has no energy this morning. Pt is in NAD.

## 2019-08-06 ENCOUNTER — Emergency Department
Admission: EM | Admit: 2019-08-06 | Discharge: 2019-08-07 | Disposition: A | Discharge status: Home / Self Care | Payer: Commercial Managed Care - PPO | Attending: Emergency Medicine | Admitting: Emergency Medicine

## 2019-08-06 ENCOUNTER — Other Ambulatory Visit: Payer: Self-pay

## 2019-08-06 ENCOUNTER — Emergency Department: Payer: Commercial Managed Care - PPO

## 2019-08-06 DIAGNOSIS — E860 Dehydration: Secondary | ICD-10-CM | POA: Diagnosis not present

## 2019-08-06 DIAGNOSIS — I10 Essential (primary) hypertension: Secondary | ICD-10-CM | POA: Insufficient documentation

## 2019-08-06 DIAGNOSIS — Z79899 Other long term (current) drug therapy: Secondary | ICD-10-CM | POA: Insufficient documentation

## 2019-08-06 DIAGNOSIS — Z87891 Personal history of nicotine dependence: Secondary | ICD-10-CM | POA: Insufficient documentation

## 2019-08-06 DIAGNOSIS — R110 Nausea: Secondary | ICD-10-CM | POA: Insufficient documentation

## 2019-08-06 DIAGNOSIS — R42 Dizziness and giddiness: Secondary | ICD-10-CM | POA: Diagnosis not present

## 2019-08-06 LAB — COMPREHENSIVE METABOLIC PANEL
ALT: 23 U/L (ref 0–44)
AST: 23 U/L (ref 15–41)
Albumin: 4.3 g/dL (ref 3.5–5.0)
Alkaline Phosphatase: 51 U/L (ref 38–126)
Anion gap: 8 (ref 5–15)
BUN: 11 mg/dL (ref 6–20)
CO2: 24 mmol/L (ref 22–32)
Calcium: 8.6 mg/dL — ABNORMAL LOW (ref 8.9–10.3)
Chloride: 103 mmol/L (ref 98–111)
Creatinine, Ser: 0.79 mg/dL (ref 0.44–1.00)
GFR calc Af Amer: >60 mL/min (ref >60)
GFR calc non Af Amer: >60 mL/min (ref >60)
Glucose, Bld: 112 mg/dL — ABNORMAL HIGH (ref 70–99)
Potassium: 3.7 mmol/L (ref 3.5–5.1)
Sodium: 135 mmol/L (ref 135–145)
Total Bilirubin: 0.6 mg/dL (ref 0.3–1.2)
Total Protein: 7.8 g/dL (ref 6.5–8.1)

## 2019-08-06 LAB — URINALYSIS, COMPLETE (UACMP) WITH MICROSCOPIC
Bilirubin Urine: NEGATIVE
Glucose, UA: NEGATIVE mg/dL
Ketones, ur: NEGATIVE mg/dL
Nitrite: NEGATIVE
Protein, ur: NEGATIVE mg/dL
Specific Gravity, Urine: 1.02 (ref 1.005–1.030)
pH: 6 (ref 5.0–8.0)

## 2019-08-06 LAB — POCT PREGNANCY, URINE: Preg Test, Ur: NEGATIVE

## 2019-08-06 LAB — CBC
HCT: 40.4 % (ref 36.0–46.0)
Hemoglobin: 13.3 g/dL (ref 12.0–15.0)
MCH: 27.6 pg (ref 26.0–34.0)
MCHC: 32.9 g/dL (ref 30.0–36.0)
MCV: 83.8 fL (ref 80.0–100.0)
Platelets: 295 10*3/uL (ref 150–400)
RBC: 4.82 MIL/uL (ref 3.87–5.11)
RDW: 12.8 % (ref 11.5–15.5)
WBC: 7.1 10*3/uL (ref 4.0–10.5)
nRBC: 0 % (ref 0.0–0.2)

## 2019-08-06 LAB — LIPASE, BLOOD: Lipase: 22 U/L (ref 11–51)

## 2019-08-06 LAB — GLUCOSE, CAPILLARY: Glucose-Capillary: 96 mg/dL (ref 70–99)

## 2019-08-06 MED ORDER — ONDANSETRON HCL 4 MG/2ML IJ SOLN
4.0000 mg | Freq: Once | INTRAMUSCULAR | Status: AC
  Administered 2019-08-06: 4 mg via INTRAVENOUS
  Filled 2019-08-06: qty 2

## 2019-08-06 MED ORDER — SODIUM CHLORIDE 0.9 % IV BOLUS
1000.0000 mL | Freq: Once | INTRAVENOUS | Status: AC
  Administered 2019-08-06: 1000 mL via INTRAVENOUS

## 2019-08-06 NOTE — ED Provider Notes (Signed)
[facility] Emergency Department Provider Note  ____________________________________________  Time seen: Approximately 11:52 PM  I have reviewed the triage vital signs and the nursing notes.   HISTORY  Chief Complaint Dizziness   HPI Mary Carpenter is a 38 y.o. female with a history of diet-controlled diabetes, hypertension, hyperlipidemia, GERD, anxiety and depression, PCOS who presents for evaluation of nausea and dizziness.   Patient reports 5 days of constant nausea, decreased oral intake, lightheadedness and feeling foggy.  She is also complaining of a anxious feeling in her stomach.  She describes it as the feeling you have when you go down a roller coaster.  She has been having it on and off for the last week.  She reports 2 new medications that started 2 days ago but those started before her symptoms started.  The medications are spironolactone and medroxyprogesterone that she is taking for PCOS.  She denies vomiting or diarrhea.  She does have constipation however that is chronic for her.  She took some MiraLAX today had several bowel movements which were normal.  She denies fever chills, chest pain or shortness of breath, sore throat, cough, dysuria or hematuria.  Past Medical History:  Diagnosis Date  . Allergic rhinitis   . Chicken pox   . Depression   . HPV (human papilloma virus) infection   . Hyperlipidemia   . Hypertension     Patient Active Problem List   Diagnosis Date Noted  . GERD (gastroesophageal reflux disease) 10/31/2015  . Constipation 10/31/2015  . Atypical chest pain 10/31/2015  . Allergic rhinitis 10/31/2015  . Anxiety and depression 10/31/2015  . Elevated blood pressure (not hypertension) 10/31/2015    History reviewed. No pertinent surgical history.  Prior to Admission medications   Medication Sig Start Date End Date Taking? Authorizing Provider  medroxyPROGESTERone (PROVERA) 10 MG tablet Take 10 mg by mouth as  directed. Take 1 tablet daily for 10 days every 2 months if you have not had a period   Yes [provider]  spironolactone (ALDACTONE) 50 MG tablet Take 50 mg by mouth daily. 05/19/19  Yes [provider]  traZODone (DESYREL) 50 MG tablet Take 50-100 mg by mouth at bedtime as needed for sleep. 08/05/19  Yes [provider]  albuterol (PROVENTIL HFA;VENTOLIN HFA) 108 (90 Base) MCG/ACT inhaler Inhale 2 puffs into the lungs every 6 (six) hours as needed for wheezing or shortness of breath. 11/08/18   Enid Derry, PA-C  Calcium Carb-Cholecalciferol (CALCIUM-VITAMIN D) 500-200 MG-UNIT tablet Take 1 tablet by mouth 2 (two) times daily.    [provider]  ondansetron (ZOFRAN ODT) 4 MG disintegrating tablet Take 1 tablet (4 mg total) by mouth every 8 (eight) hours as needed. 08/07/19   Nita Sickle, MD    Allergies Patient has no known allergies.  Family History  Problem Relation Age of Onset  . Diabetes Mother   . Mental illness Mother   . Hypertension Mother   . Hypertension Father   . Hypertension Maternal Grandmother   . Diabetes Maternal Grandmother   . Diabetes Paternal Grandmother   . Hypertension Paternal Grandmother     Social History Social History   Tobacco Use  . Smoking status: Former Smoker    Quit date: 10/31/2014    Years since quitting: 4.7  . Smokeless tobacco: Never Used  Substance Use Topics  . Alcohol use: Yes    Alcohol/week: 0.0 - 2.0 standard drinks  . Drug use: No  Review of Systems  Constitutional: Negative for fever. + Lightheadedness Eyes: Negative for visual changes. ENT: Negative for sore throat. Neck: No neck pain  Cardiovascular: Negative for chest pain. Respiratory: Negative for shortness of breath. Gastrointestinal: Negative for abdominal pain, vomiting or diarrhea. + Nausea Genitourinary: Negative for dysuria. Musculoskeletal: Negative for back pain. Skin: Negative for rash. Neurological: Negative  for headaches, weakness or numbness. Psych: No SI or HI  ____________________________________________   PHYSICAL EXAM:  VITAL SIGNS: ED Triage Vitals  Enc Vitals Group     BP 08/06/19 2033 (!) 145/84     Pulse Rate 08/06/19 2033 84     Resp 08/06/19 2033 18     Temp 08/06/19 2033 99 F (37.2 C)     Temp Source 08/06/19 2033 Oral     SpO2 08/06/19 2033 96 %     Weight 08/06/19 2034 185 lb (83.9 kg)     Height 08/06/19 2034 5\' 8"  (1.727 m)     Head Circumference --      Peak Flow --      Pain Score 08/06/19 2033 6     Pain Loc --      Pain Edu? --      Excl. in [place]? --     Constitutional: Alert and oriented. Well appearing and in no apparent distress. HEENT:      Head: Normocephalic and atraumatic.         Eyes: Conjunctivae are normal. Sclera is non-icteric.       Mouth/Throat: Mucous membranes are moist.       Neck: Supple with no signs of meningismus. Cardiovascular: Regular rate and rhythm. No murmurs, gallops, or rubs. 2+ symmetrical distal pulses are present in all extremities. No JVD. Respiratory: Normal respiratory effort. Lungs are clear to auscultation bilaterally. No wheezes, crackles, or rhonchi.  Gastrointestinal: Soft, non tender, and non distended with positive bowel sounds. No rebound or guarding. Genitourinary: No CVA tenderness. Musculoskeletal: Nontender with normal range of motion in all extremities. No edema, cyanosis, or erythema of extremities. Neurologic: Normal speech and language. Face is symmetric. Moving all extremities. No gross focal neurologic deficits are appreciated. Skin: Skin is warm, dry and intact. No rash noted. Psychiatric: Mood and affect are normal. Speech and behavior are normal.  ____________________________________________   LABS (all labs ordered are listed, but only abnormal results are displayed)  Labs Reviewed  COMPREHENSIVE METABOLIC PANEL - Abnormal; Notable for the following components:      Result Value   Glucose, Bld  112 (*)    Calcium 8.6 (*)    All other components within normal limits  URINALYSIS, COMPLETE (UACMP) WITH MICROSCOPIC - Abnormal; Notable for the following components:   Color, Urine YELLOW (*)    APPearance HAZY (*)    Hgb urine dipstick SMALL (*)    Leukocytes,Ua SMALL (*)    Bacteria, UA RARE (*)    All other components within normal limits  URINE CULTURE  LIPASE, BLOOD  CBC  GLUCOSE, CAPILLARY  POC URINE PREG, ED  POCT PREGNANCY, URINE  TROPONIN I (HIGH SENSITIVITY)   ____________________________________________  EKG  ED ECG REPORT I, Rudene Re, the attending physician, personally viewed and interpreted this ECG.  Normal sinus rhythm, rate of 65, normal intervals, normal axis, no ST elevations or depressions.  Normal EKG. ____________________________________________  RADIOLOGY  I have personally reviewed the images performed during this visit and I agree with the Radiologist's read.   Interpretation by Radiologist:  DG Abdomen 1 View  Result Date: 08/06/2019 CLINICAL DATA:  Abdominal pain and possible constipation EXAM: ABDOMEN - 1 VIEW COMPARISON:  None FINDINGS: Scattered large and small bowel gas is noted. No significant retained fecal material is noted. No free air is seen. No bony abnormality is noted. No mass or calcifications are seen. IMPRESSION: No acute abnormality noted. Electronically Signed   By: Alcide CleverMark  Lukens M.D.   On: 08/06/2019 23:54    ____________________________________________   PROCEDURES  Procedure(s) performed: None Procedures Critical Care performed:  None ____________________________________________   INITIAL IMPRESSION / ASSESSMENT AND PLAN / ED COURSE  38 y.o. female with a history of diet-controlled diabetes, hypertension, hyperlipidemia, GERD, anxiety and depression, PCOS who presents for evaluation of nausea and dizziness x 5 days.  Patient is extremely well-appearing and in no distress with normal vital signs, physical  exam is normal.  Initially in triage patient had a temp of 57F while wearing several layers of clothing due to cold weather outside.  Once in the room her temperature was 98.56F.  Labs showed normal white count, no anemia, no significant electrolyte abnormalities, no DKA, no dehydration, no AKI.  EKG and troponin with no evidence of ischemia.  KUB with no significant constipation.  Pregnancy test negative.  UA with some rare bacteria but patient is asymptomatic therefore will send for culture.  Symptoms possibly from some mild dehydration.  Will give IV fluids and Zofran and reassess.    _________________________ 1:30 AM on 08/07/2019 -----------------------------------------  Patient reassessed after receiving a liter of fluids.  She reports that she feels "amazing".  No longer feeling dizzy, feels back to baseline.  Possibly some mild dehydration.  Recommended pushing oral fluids at home.  Will give a prescription for Zofran for nausea.  Recommended follow-up with PCP.  Discussed my standard return precautions    As part of my medical decision making, I reviewed the following data within the electronic MEDICAL RECORD NUMBER Nursing notes reviewed and incorporated, Labs reviewed , EKG interpreted , Old chart reviewed, Radiograph reviewed , Notes from prior ED visits and [place] Controlled Substance Database   Please note:  Patient was evaluated in Emergency Department today for the symptoms described in the history of present illness. Patient was evaluated in the context of the global COVID-19 pandemic, which necessitated consideration that the patient might be at risk for infection with the SARS-CoV-2 virus that causes COVID-19. Institutional protocols and algorithms that pertain to the evaluation of patients at risk for COVID-19 are in a state of rapid change based on information released by regulatory bodies including the CDC and federal and state organizations. These policies and algorithms were followed  during the patient's care in the ED.  Some ED evaluations and interventions may be delayed as a result of limited staffing during the pandemic.   ____________________________________________   FINAL CLINICAL IMPRESSION(S) / ED DIAGNOSES   Final diagnoses:  Lightheadedness  Dehydration      NEW MEDICATIONS STARTED DURING THIS VISIT:  ED Discharge Orders         Ordered    ondansetron (ZOFRAN ODT) 4 MG disintegrating tablet  Every 8 hours PRN     08/07/19 0131           Note:  This document was prepared using Dragon voice recognition software and may include unintentional dictation errors.    Nita SickleVeronese, [place], MD 08/07/19 [phone]

## 2019-08-06 NOTE — ED Triage Notes (Addendum)
Pt to the er for nausea, dizziness, feeling foggy. Pt was dx with diabetes in September but is diet controlled and on no meds. The current symptoms began a week ago. Pt states she feels a pressure in her stomach. Pt has changed her eating habits since diabetes diagnosis. Pt ate 1 hour PTA and blood sugar is 96 at this time. Pt is also on medroxyprogesterone and spironolactone. Pt does not appear in distress at this time.

## 2019-08-07 ENCOUNTER — Other Ambulatory Visit: Payer: Self-pay

## 2019-08-07 LAB — TROPONIN I (HIGH SENSITIVITY): Troponin I (High Sensitivity): <2 ng/L (ref <18)

## 2019-08-07 MED ORDER — ONDANSETRON 4 MG PO TBDP: 4.0000 mg | ORAL_TABLET | Freq: Three times a day (TID) | ORAL | 0 refills | Status: AC | PRN

## 2019-08-07 NOTE — ED Notes (Signed)
Pt helped to toilet, pt denies dizziness, pt states she feels "amazing". Pt instructed to use red pull cord next to toilet if she needs help.

## 2019-08-08 LAB — URINE CULTURE: Culture: 10000 — AB

## 2019-08-09 NOTE — Progress Notes (Signed)
ED Antimicrobial Stewardship Positive Culture Follow Up   Mary Carpenter is an 38 y.o. female who presented to [facility] on 08/06/2019 with a chief complaint of nausea.  Chief Complaint  Patient presents with  . Dizziness    Recent Results (from the past 720 hour(s))  Urine culture     Status: Abnormal   Collection Time: 08/06/19  8:49 PM   Specimen: Urine, Random  Result Value Ref Range Status   Specimen Description   Final    URINE, RANDOM Performed at [facility], [address] [place], [place] [address]    Special Requests   Final    NONE Performed at [facility], [address] [place], [place] [address]    Culture (A)  Final    10,000 COLONIES/mL DIPHTHEROIDS(CORYNEBACTERIUM SPECIES) Standardized susceptibility testing for this organism is not available. Performed at [facility], [place] [address], [place], [place] [address]    Report Status 08/08/2019 FINAL  Final   38 year old patient presented with nausea.  She had recently started spironolactone and medroxyprogesterone two days prior.  Patient was negative for fever and dysuria.  WBC wnl.  A urinalysis was drawn showing 10k diphtherioids with squamous cells in urine sample (not a clean catch).  Per MD note, patient felt "amazing" after 1 liter of fluids, and was sent home on zofran.  After presenting Urine culture result to Dr. Jacqualine Code post-discharge after urine culture resulted, he decided not to recommend treatment.   ED Provider: Dr. Kela Millin, PharmD Pharmacy Resident  08/09/2019 3:26 PM

## 2019-12-10 ENCOUNTER — Other Ambulatory Visit: Payer: Commercial Managed Care - PPO

## 2020-01-01 ENCOUNTER — Ambulatory Visit: Payer: Commercial Managed Care - PPO | Attending: Internal Medicine

## 2020-01-01 DIAGNOSIS — Z23 Encounter for immunization: Secondary | ICD-10-CM

## 2020-01-01 NOTE — Progress Notes (Signed)
   Covid-19 Vaccination Clinic  Name:  KEANDRIA BERROCAL    MRN: [id number] DOB: 11-04-1980  01/01/2020  Ms. Balthaser was observed post Covid-19 immunization for 15 minutes without incident. She was provided with Vaccine Information Sheet and instruction to access the V-Safe system.   Ms. Nakayama was instructed to call 911 with any severe reactions post vaccine: Marland Kitchen Difficulty breathing  . Swelling of face and throat  . A fast heartbeat  . A bad rash all over body  . Dizziness and weakness   Immunizations Administered    Name Date Dose VIS Date Route   Pfizer COVID-19 Vaccine 01/01/2020  8:12 AM 0.3 mL 10/21/2018 Intramuscular   Manufacturer: [employer], [employer]   Lot: [id number]   NDC: [address]-3

## 2020-01-26 ENCOUNTER — Ambulatory Visit: Payer: Commercial Managed Care - PPO

## 2020-02-05 ENCOUNTER — Ambulatory Visit: Payer: Self-pay | Attending: Internal Medicine

## 2020-02-05 DIAGNOSIS — Z23 Encounter for immunization: Secondary | ICD-10-CM

## 2020-02-05 NOTE — Progress Notes (Signed)
   Covid-19 Vaccination Clinic  Name:  MASYN FULLAM    MRN: [id number] DOB: 03/28/1981  02/05/2020  Ms. Yow was observed post Covid-19 immunization for 15 minutes without incident. She was provided with Vaccine Information Sheet and instruction to access the V-Safe system.   Ms. Pio was instructed to call 911 with any severe reactions post vaccine: Marland Kitchen Difficulty breathing  . Swelling of face and throat  . A fast heartbeat  . A bad rash all over body  . Dizziness and weakness   Immunizations Administered    Name Date Dose VIS Date Route   Pfizer COVID-19 Vaccine 02/05/2020 11:52 AM 0.3 mL 10/21/2018 Intramuscular   Manufacturer: [employer], [employer]   Lot: [id number]   NDC: [address]-5

## 2020-05-31 ENCOUNTER — Other Ambulatory Visit: Payer: Self-pay

## 2020-05-31 ENCOUNTER — Emergency Department
Admission: EM | Admit: 2020-05-31 | Discharge: 2020-05-31 | Disposition: A | Discharge status: Home / Self Care | Payer: Managed Care, Other (non HMO) | Attending: Emergency Medicine | Admitting: Emergency Medicine

## 2020-05-31 ENCOUNTER — Emergency Department
Admission: EM | Admit: 2020-05-31 | Discharge: 2020-05-31 | Discharge status: Against Medical Advice | Payer: Managed Care, Other (non HMO)

## 2020-05-31 ENCOUNTER — Encounter: Payer: Self-pay | Admitting: Emergency Medicine

## 2020-05-31 DIAGNOSIS — Z87891 Personal history of nicotine dependence: Secondary | ICD-10-CM | POA: Diagnosis not present

## 2020-05-31 DIAGNOSIS — N3001 Acute cystitis with hematuria: Secondary | ICD-10-CM | POA: Insufficient documentation

## 2020-05-31 DIAGNOSIS — R110 Nausea: Secondary | ICD-10-CM | POA: Diagnosis present

## 2020-05-31 DIAGNOSIS — Z79899 Other long term (current) drug therapy: Secondary | ICD-10-CM | POA: Insufficient documentation

## 2020-05-31 DIAGNOSIS — I10 Essential (primary) hypertension: Secondary | ICD-10-CM | POA: Insufficient documentation

## 2020-05-31 LAB — CBC WITH DIFFERENTIAL/PLATELET
Abs Immature Granulocytes: 0.02 10*3/uL (ref 0.00–0.07)
Basophils Absolute: 0 10*3/uL (ref 0.0–0.1)
Basophils Relative: 0 %
Eosinophils Absolute: 0.1 10*3/uL (ref 0.0–0.5)
Eosinophils Relative: 1 %
HCT: 43.8 % (ref 36.0–46.0)
Hemoglobin: 14.3 g/dL (ref 12.0–15.0)
Immature Granulocytes: 0 %
Lymphocytes Relative: 37 %
Lymphs Abs: 2.6 10*3/uL (ref 0.7–4.0)
MCH: 27.9 pg (ref 26.0–34.0)
MCHC: 32.6 g/dL (ref 30.0–36.0)
MCV: 85.4 fL (ref 80.0–100.0)
Monocytes Absolute: 0.5 10*3/uL (ref 0.1–1.0)
Monocytes Relative: 7 %
Neutro Abs: 3.7 10*3/uL (ref 1.7–7.7)
Neutrophils Relative %: 55 %
Platelets: 359 10*3/uL (ref 150–400)
RBC: 5.13 MIL/uL — ABNORMAL HIGH (ref 3.87–5.11)
RDW: 13.5 % (ref 11.5–15.5)
WBC: 6.9 10*3/uL (ref 4.0–10.5)
nRBC: 0 % (ref 0.0–0.2)

## 2020-05-31 LAB — URINALYSIS, COMPLETE (UACMP) WITH MICROSCOPIC
Bacteria, UA: NONE SEEN
Bilirubin Urine: NEGATIVE
Glucose, UA: NEGATIVE mg/dL
Ketones, ur: NEGATIVE mg/dL
Nitrite: NEGATIVE
Protein, ur: NEGATIVE mg/dL
Specific Gravity, Urine: 1.013 (ref 1.005–1.030)
pH: 6 (ref 5.0–8.0)

## 2020-05-31 LAB — LIPASE, BLOOD: Lipase: 21 U/L (ref 11–51)

## 2020-05-31 LAB — COMPREHENSIVE METABOLIC PANEL
ALT: 16 U/L (ref 0–44)
AST: 17 U/L (ref 15–41)
Albumin: 4 g/dL (ref 3.5–5.0)
Alkaline Phosphatase: 52 U/L (ref 38–126)
Anion gap: 12 (ref 5–15)
BUN: 10 mg/dL (ref 6–20)
CO2: 26 mmol/L (ref 22–32)
Calcium: 8.6 mg/dL — ABNORMAL LOW (ref 8.9–10.3)
Chloride: 102 mmol/L (ref 98–111)
Creatinine, Ser: 0.82 mg/dL (ref 0.44–1.00)
GFR calc non Af Amer: >60 mL/min (ref >60)
Glucose, Bld: 91 mg/dL (ref 70–99)
Potassium: 4 mmol/L (ref 3.5–5.1)
Sodium: 140 mmol/L (ref 135–145)
Total Bilirubin: 0.7 mg/dL (ref 0.3–1.2)
Total Protein: 7.2 g/dL (ref 6.5–8.1)

## 2020-05-31 LAB — POCT PREGNANCY, URINE: Preg Test, Ur: NEGATIVE

## 2020-05-31 MED ORDER — CEPHALEXIN 500 MG PO CAPS: 500.0000 mg | ORAL_CAPSULE | Freq: Three times a day (TID) | ORAL | 0 refills | Status: AC

## 2020-05-31 NOTE — ED Provider Notes (Signed)
Emergency Department Provider Note  ____________________________________________  Time seen: Approximately 7:45 PM  I have reviewed the triage vital signs and the nursing notes.   HISTORY  Chief Complaint Nausea   Historian Patient   HPI Mary Carpenter is a 39 y.o. female presents to the emergency department with nausea for the past 2 to 3 days.  Patient states that her nausea improves when she consumes Gatorade.  She states that she has had increased urinary frequency today but no dysuria.  She states that she is unsure of hematuria as she recently finished her menstrual cycle.  She denies low back pain.  No vomiting at home.  She states that she is sexually active but has no concerns for STDs.  No pelvic pain or dyspareunia.  No changes in vaginal discharge.  No other alleviating measures have been attempted.  Patient states that she has been afebrile at home.   Past Medical History:  Diagnosis Date  . Allergic rhinitis   . Chicken pox   . Depression   . HPV (human papilloma virus) infection   . Hyperlipidemia   . Hypertension      Immunizations up to date:  Yes.     Past Medical History:  Diagnosis Date  . Allergic rhinitis   . Chicken pox   . Depression   . HPV (human papilloma virus) infection   . Hyperlipidemia   . Hypertension     Patient Active Problem List   Diagnosis Date Noted  . GERD (gastroesophageal reflux disease) 10/31/2015  . Constipation 10/31/2015  . Atypical chest pain 10/31/2015  . Allergic rhinitis 10/31/2015  . Anxiety and depression 10/31/2015  . Elevated blood pressure (not hypertension) 10/31/2015    History reviewed. No pertinent surgical history.  Prior to Admission medications   Medication Sig Start Date End Date Taking? Authorizing Provider  albuterol (PROVENTIL HFA;VENTOLIN HFA) 108 (90 Base) MCG/ACT inhaler Inhale 2 puffs into the lungs every 6 (six) hours as needed for wheezing or shortness of breath. 11/08/18   Enid Derry, PA-C  Calcium Carb-Cholecalciferol (CALCIUM-VITAMIN D) 500-200 MG-UNIT tablet Take 1 tablet by mouth 2 (two) times daily.    [provider]  cephALEXin (KEFLEX) 500 MG capsule Take 1 capsule (500 mg total) by mouth 3 (three) times daily for 7 days. 05/31/20 06/07/20  Orvil Feil, PA-C  medroxyPROGESTERone (PROVERA) 10 MG tablet Take 10 mg by mouth as directed. Take 1 tablet daily for 10 days every 2 months if you have not had a period    [provider]  ondansetron (ZOFRAN ODT) 4 MG disintegrating tablet Take 1 tablet (4 mg total) by mouth every 8 (eight) hours as needed. 08/07/19   Nita Sickle, MD  spironolactone (ALDACTONE) 50 MG tablet Take 50 mg by mouth daily. 05/19/19   [provider]  traZODone (DESYREL) 50 MG tablet Take 50-100 mg by mouth at bedtime as needed for sleep. 08/05/19   [provider]    Allergies Patient has no known allergies.  Family History  Problem Relation Age of Onset  . Diabetes Mother   . Mental illness Mother   . Hypertension Mother   . Hypertension Father   . Hypertension Maternal Grandmother   . Diabetes Maternal Grandmother   . Diabetes Paternal Grandmother   . Hypertension Paternal Grandmother     Social History Social History   Tobacco Use  . Smoking status: Former Smoker    Quit date: 10/31/2014    Years since quitting:  5.5  . Smokeless tobacco: Never Used  Vaping Use  . Vaping Use: Never used  Substance Use Topics  . Alcohol use: Yes    Alcohol/week: 0.0 - 2.0 standard drinks  . Drug use: No     Review of Systems  Constitutional: No fever/chills Eyes:  No discharge ENT: No upper respiratory complaints. Respiratory: no cough. No SOB/ use of accessory muscles to breath Gastrointestinal: Patient has nausea.  Musculoskeletal: Negative for musculoskeletal pain. Skin: Negative for rash, abrasions, lacerations,  ecchymosis.    ____________________________________________   PHYSICAL EXAM:  VITAL SIGNS: ED Triage Vitals  Enc Vitals Group     BP 05/31/20 1742 (!) 148/97     Pulse Rate 05/31/20 1742 76     Resp 05/31/20 1742 18     Temp 05/31/20 1742 98 F (36.7 C)     Temp Source 05/31/20 1742 Oral     SpO2 05/31/20 1742 100 %     Weight 05/31/20 1743 175 lb (79.4 kg)     Height 05/31/20 1743 5\' 8"  (1.727 m)     Head Circumference --      Peak Flow --      Pain Score 05/31/20 1749 0     Pain Loc --      Pain Edu? --      Excl. in [place]? --      Constitutional: Alert and oriented. Well appearing and in no acute distress. Eyes: Conjunctivae are normal. PERRL. EOMI. Head: Atraumatic. Cardiovascular: Normal rate, regular rhythm. Normal S1 and S2.  Good peripheral circulation. Respiratory: Normal respiratory effort without tachypnea or retractions. Lungs CTAB. Good air entry to the bases with no decreased or absent breath sounds Gastrointestinal: Bowel sounds x 4 quadrants. Soft and nontender to palpation. No guarding or rigidity. No distention. Musculoskeletal: Full range of motion to all extremities. No obvious deformities noted Neurologic:  Normal for age. No gross focal neurologic deficits are appreciated.  Skin:  Skin is warm, dry and intact. No rash noted. Psychiatric: Mood and affect are normal for age. Speech and behavior are normal.   ____________________________________________   LABS (all labs ordered are listed, but only abnormal results are displayed)  Labs Reviewed  URINALYSIS, COMPLETE (UACMP) WITH MICROSCOPIC - Abnormal; Notable for the following components:      Result Value   Color, Urine YELLOW (*)    APPearance HAZY (*)    Hgb urine dipstick LARGE (*)    Leukocytes,Ua LARGE (*)    All other components within normal limits  CBC WITH DIFFERENTIAL/PLATELET - Abnormal; Notable for the following components:   RBC 5.13 (*)    All other components within normal  limits  COMPREHENSIVE METABOLIC PANEL - Abnormal; Notable for the following components:   Calcium 8.6 (*)    All other components within normal limits  URINE CULTURE  LIPASE, BLOOD  POC URINE PREG, ED  POCT PREGNANCY, URINE   ____________________________________________  EKG   ____________________________________________  RADIOLOGY   No results found.  ____________________________________________    PROCEDURES  Procedure(s) performed:     Procedures     Medications - No data to display   ____________________________________________   INITIAL IMPRESSION / ASSESSMENT AND PLAN / ED COURSE  Pertinent labs & imaging results that were available during my care of the patient were reviewed by me and considered in my medical decision making (see chart for details).  Clinical Course as of May 31 1944  Tue May 31, 2020  Jun 02, 2020 Mucus: PRESENT [JW]  Clinical Course User Index [JW] Orvil Feil, PA-C   Assessment and plan Nausea UTI 39 year old female presents to the emergency department with nausea for the past 2 to 3 days as well as increased urinary frequency.  Patient was mildly hypertensive at triage but vital signs were otherwise reassuring.  Patient had no CVA tenderness or suprapubic tenderness on exam.  CBC and CMP were reassuring.  Urine pregnancy test was negative.  Lipase was within reference range.  Urinalysis revealed a large amount of leukocytes and blood concerning for UTI.  Urine culture is pending at this time.  Patient was discharged with Keflex to be taken 3 times daily for the next 7 days.  Return precautions were given to return with new or worsening symptoms.   ____________________________________________  FINAL CLINICAL IMPRESSION(S) / ED DIAGNOSES  Final diagnoses:  Nausea  Acute cystitis with hematuria      NEW MEDICATIONS STARTED DURING THIS VISIT:  ED Discharge Orders         Ordered    cephALEXin (KEFLEX) 500 MG capsule   3 times daily        05/31/20 1939              This chart was dictated using voice recognition software/Dragon. Despite best efforts to proofread, errors can occur which can change the meaning. Any change was purely unintentional.     Orvil Feil, PA-C 05/31/20 1948    Sharman Cheek, MD 06/01/20 2342

## 2020-05-31 NOTE — ED Notes (Signed)
See triage note: pt with nausea x 2-3 days. Pt states she feels better when she eats. Denies diarrhea. Pt states she has been drinking plenty of fluids. States she has cramping in her stomach. Pt alert & oriented, nad noted.

## 2020-05-31 NOTE — Discharge Instructions (Signed)
Take Keflex three times daily for the next seven days.  

## 2020-05-31 NOTE — ED Notes (Signed)
Lab stating green top is hemolysed, will need to recollect.

## 2020-05-31 NOTE — ED Notes (Signed)
Pt advised front desk staff that she was leaving and would be back later

## 2020-05-31 NOTE — ED Triage Notes (Addendum)
Pt arrived via POV with reports of feeling nauseated x 2-3 days, reports gatorade helps with the nausea.    Pt denies any diarrhea. Pt reports she has been taking a stool softener and had 3 normal bowel movements today.  Pt denies any pain at this time.  Pt states she has had a good appetite and has not had any difficulty eating.

## 2020-06-01 LAB — URINE CULTURE

## 2020-06-02 ENCOUNTER — Emergency Department (HOSPITAL_COMMUNITY)
Admission: EM | Admit: 2020-06-02 | Discharge: 2020-06-02 | Disposition: A | Discharge status: Home / Self Care | Payer: Managed Care, Other (non HMO) | Attending: Emergency Medicine | Admitting: Emergency Medicine

## 2020-06-02 ENCOUNTER — Emergency Department (HOSPITAL_COMMUNITY): Payer: Managed Care, Other (non HMO)

## 2020-06-02 ENCOUNTER — Encounter (HOSPITAL_COMMUNITY): Payer: Self-pay | Admitting: Emergency Medicine

## 2020-06-02 ENCOUNTER — Other Ambulatory Visit: Payer: Self-pay

## 2020-06-02 DIAGNOSIS — R1011 Right upper quadrant pain: Secondary | ICD-10-CM | POA: Diagnosis present

## 2020-06-02 DIAGNOSIS — A599 Trichomoniasis, unspecified: Secondary | ICD-10-CM | POA: Insufficient documentation

## 2020-06-02 DIAGNOSIS — B9689 Other specified bacterial agents as the cause of diseases classified elsewhere: Secondary | ICD-10-CM

## 2020-06-02 DIAGNOSIS — Z87891 Personal history of nicotine dependence: Secondary | ICD-10-CM | POA: Insufficient documentation

## 2020-06-02 DIAGNOSIS — N760 Acute vaginitis: Secondary | ICD-10-CM | POA: Diagnosis not present

## 2020-06-02 DIAGNOSIS — I10 Essential (primary) hypertension: Secondary | ICD-10-CM | POA: Diagnosis not present

## 2020-06-02 DIAGNOSIS — N3000 Acute cystitis without hematuria: Secondary | ICD-10-CM | POA: Diagnosis not present

## 2020-06-02 LAB — URINALYSIS, ROUTINE W REFLEX MICROSCOPIC
Bilirubin Urine: NEGATIVE
Glucose, UA: NEGATIVE mg/dL
Ketones, ur: NEGATIVE mg/dL
Nitrite: NEGATIVE
Protein, ur: NEGATIVE mg/dL
RBC / HPF: >50 RBC/hpf — ABNORMAL HIGH (ref 0–5)
Specific Gravity, Urine: 1.01 (ref 1.005–1.030)
WBC, UA: >50 WBC/hpf — ABNORMAL HIGH (ref 0–5)
pH: 6 (ref 5.0–8.0)

## 2020-06-02 LAB — COMPREHENSIVE METABOLIC PANEL
ALT: 17 U/L (ref 0–44)
AST: 18 U/L (ref 15–41)
Albumin: 3.6 g/dL (ref 3.5–5.0)
Alkaline Phosphatase: 51 U/L (ref 38–126)
Anion gap: 10 (ref 5–15)
BUN: 9 mg/dL (ref 6–20)
CO2: 25 mmol/L (ref 22–32)
Calcium: 9 mg/dL (ref 8.9–10.3)
Chloride: 102 mmol/L (ref 98–111)
Creatinine, Ser: 0.79 mg/dL (ref 0.44–1.00)
GFR calc non Af Amer: >60 mL/min (ref >60)
Glucose, Bld: 127 mg/dL — ABNORMAL HIGH (ref 70–99)
Potassium: 3.7 mmol/L (ref 3.5–5.1)
Sodium: 137 mmol/L (ref 135–145)
Total Bilirubin: 0.5 mg/dL (ref 0.3–1.2)
Total Protein: 6.5 g/dL (ref 6.5–8.1)

## 2020-06-02 LAB — HIV ANTIBODY (ROUTINE TESTING W REFLEX): HIV Screen 4th Generation wRfx: NONREACTIVE

## 2020-06-02 LAB — CBC
HCT: 41.5 % (ref 36.0–46.0)
Hemoglobin: 12.7 g/dL (ref 12.0–15.0)
MCH: 27 pg (ref 26.0–34.0)
MCHC: 30.6 g/dL (ref 30.0–36.0)
MCV: 88.1 fL (ref 80.0–100.0)
Platelets: 362 10*3/uL (ref 150–400)
RBC: 4.71 MIL/uL (ref 3.87–5.11)
RDW: 13.2 % (ref 11.5–15.5)
WBC: 7 10*3/uL (ref 4.0–10.5)

## 2020-06-02 LAB — I-STAT BETA HCG BLOOD, ED (MC, WL, AP ONLY): I-stat hCG, quantitative: 15.6 m[IU]/mL — ABNORMAL HIGH (ref <5)

## 2020-06-02 LAB — WET PREP, GENITAL
Sperm: NONE SEEN
Yeast Wet Prep HPF POC: NONE SEEN

## 2020-06-02 LAB — LIPASE, BLOOD: Lipase: 21 U/L (ref 11–51)

## 2020-06-02 LAB — PREGNANCY, URINE: Preg Test, Ur: NEGATIVE

## 2020-06-02 MED ORDER — ONDANSETRON HCL 4 MG PO TABS: 4.0000 mg | ORAL_TABLET | Freq: Four times a day (QID) | ORAL | 0 refills | Status: AC

## 2020-06-02 MED ORDER — LIDOCAINE HCL (PF) 1 % IJ SOLN
INTRAMUSCULAR | Status: AC
  Filled 2020-06-02: qty 5

## 2020-06-02 MED ORDER — CEFTRIAXONE SODIUM 500 MG IJ SOLR
500.0000 mg | Freq: Once | INTRAMUSCULAR | Status: AC
  Administered 2020-06-02: 500 mg via INTRAMUSCULAR
  Filled 2020-06-02: qty 500

## 2020-06-02 MED ORDER — FLUCONAZOLE 150 MG PO TABS: ORAL_TABLET | ORAL | 0 refills | Status: AC

## 2020-06-02 MED ORDER — FAMOTIDINE 20 MG PO TABS: 20.0000 mg | ORAL_TABLET | Freq: Two times a day (BID) | ORAL | 0 refills | Status: DC

## 2020-06-02 MED ORDER — DOXYCYCLINE HYCLATE 100 MG PO CAPS: 100.0000 mg | ORAL_CAPSULE | Freq: Two times a day (BID) | ORAL | 0 refills | Status: AC

## 2020-06-02 MED ORDER — METRONIDAZOLE 500 MG PO TABS: 500.0000 mg | ORAL_TABLET | Freq: Two times a day (BID) | ORAL | 0 refills | Status: AC

## 2020-06-02 MED ORDER — SODIUM CHLORIDE 0.9 % IV BOLUS
500.0000 mL | Freq: Once | INTRAVENOUS | Status: AC
  Administered 2020-06-02: 500 mL via INTRAVENOUS

## 2020-06-02 MED ORDER — FAMOTIDINE IN NACL 20-0.9 MG/50ML-% IV SOLN
20.0000 mg | Freq: Once | INTRAVENOUS | Status: AC
  Administered 2020-06-02: 20 mg via INTRAVENOUS
  Filled 2020-06-02: qty 50

## 2020-06-02 MED ORDER — ONDANSETRON HCL 4 MG/2ML IJ SOLN
4.0000 mg | Freq: Once | INTRAMUSCULAR | Status: AC
  Administered 2020-06-02: 4 mg via INTRAVENOUS
  Filled 2020-06-02: qty 2

## 2020-06-02 NOTE — ED Triage Notes (Signed)
Pt arrives to ED with complaints of RUQ and epigastric abdominal pain x1 week. States feeling nauseated constaly. Tums has helped with pain. Denies emesis or diarrhea.

## 2020-06-02 NOTE — ED Notes (Signed)
Pt ambulatory with steady gait to bathroom, nadn, vss on ccm.

## 2020-06-02 NOTE — ED Notes (Signed)
Pt aaox4, gcs15, breathing nonlabored with symmetrical chest rise bilaterally. VSS ONCCM, SINUS RHYTHM AT THIS TIME, BOWEL SOUNDS WNL, ABD NONDISTENDED, PAINFUL UPON PALPATION, RECENT DX WITH UTI. BM wnl. No other complaints, side rails up, call bell in reach.

## 2020-06-02 NOTE — Discharge Instructions (Addendum)
You were given a prescription for Flagyl to treat trichomonas and bacterial vaginosis.  Please take as directed.  Do not drink alcohol while taking this medication as it will make you feel very sick.  If your chlamydia or gonorrhea test is positive then take the doxycycline.  You can continue taking your antibiotic for your urinary tract infection.  I have also given a prescription for fluconazole which will treat a yeast infection in case you develop any symptoms of vaginal discharge or itching.  You do not need to take this medication if you do not have symptoms.  You have been tested for HIV, syphilis, chlamydia and gonorrhea.  These results will be available in approximately 3 days and you will be contacted by the hospital if the results are positive. Avoid sexual contact until you are aware of the results, and please inform all sexual partners if you test positive for any of these diseases.  A culture was sent of your urine today to determine if there is any bacterial growth. If the results of the culture are positive and you require an antibiotic or a change of your prescribed antibiotic you will be contacted by the hospital. If the results are negative you will not be contacted.  You were given a prescription for Pepcid and Zofran for your acid reflux and nausea.  Take as directed.  Please make an appointment to follow-up with your regular doctor in the next 5 to 7 days and return to the emergency department for new or worsening symptoms.

## 2020-06-02 NOTE — ED Provider Notes (Signed)
[facility] EMERGENCY DEPARTMENT Provider Note   CSN: [id number] Arrival date & time: 06/02/20  1739     History Chief Complaint  Patient presents with  . Abdominal Pain    Mary Carpenter is a 39 y.o. female.  HPI   Pt is a 39 y/o female with a h/o allergic rhinitis, chicken pox, depression, HPV, HLD, HTN, who presents to the ED today for eval of abd pain located to the RUQ. Pain has been intermittent. Seems to be worse when she eats. It also occurs when she does not eat. Rates pain 3/10.  She reports associated nausea. Denies vomiting, diarrhea Has a h/o chronic constipation but she is currently not constipated. Had 2 BMs today. Denies dysuria, frequency, urgency, of fevers.   Sxs are worse with certain foods such as red meat, corn.   She has tried taking tums and other OTC meds which do help her symptoms. Having a BM helps relieve her sxs.  She was recently seen in the ED 2 days ago and dx with UTI. She was given Rx for keflex. Reviewed labs, urine culture grew out multiple species.   Past Medical History:  Diagnosis Date  . Allergic rhinitis   . Chicken pox   . Depression   . HPV (human papilloma virus) infection   . Hyperlipidemia   . Hypertension     Patient Active Problem List   Diagnosis Date Noted  . GERD (gastroesophageal reflux disease) 10/31/2015  . Constipation 10/31/2015  . Atypical chest pain 10/31/2015  . Allergic rhinitis 10/31/2015  . Anxiety and depression 10/31/2015  . Elevated blood pressure (not hypertension) 10/31/2015    History reviewed. No pertinent surgical history.   OB History   No obstetric history on file.     Family History  Problem Relation Age of Onset  . Diabetes Mother   . Mental illness Mother   . Hypertension Mother   . Hypertension Father   . Hypertension Maternal Grandmother   . Diabetes Maternal Grandmother   . Diabetes Paternal Grandmother   . Hypertension Paternal Grandmother     Social  History   Tobacco Use  . Smoking status: Former Smoker    Quit date: 10/31/2014    Years since quitting: 5.5  . Smokeless tobacco: Never Used  Vaping Use  . Vaping Use: Never used  Substance Use Topics  . Alcohol use: Yes    Alcohol/week: 0.0 - 2.0 standard drinks  . Drug use: No    Home Medications Prior to Admission medications   Medication Sig Start Date End Date Taking? Authorizing Provider  albuterol (PROVENTIL HFA;VENTOLIN HFA) 108 (90 Base) MCG/ACT inhaler Inhale 2 puffs into the lungs every 6 (six) hours as needed for wheezing or shortness of breath. 11/08/18   Enid Derry, PA-C  Calcium Carb-Cholecalciferol (CALCIUM-VITAMIN D) 500-200 MG-UNIT tablet Take 1 tablet by mouth 2 (two) times daily.    [provider]  cephALEXin (KEFLEX) 500 MG capsule Take 1 capsule (500 mg total) by mouth 3 (three) times daily for 7 days. 05/31/20 06/07/20  Orvil Feil, PA-C  doxycycline (VIBRAMYCIN) 100 MG capsule Take 1 capsule (100 mg total) by mouth 2 (two) times daily for 7 days. 06/02/20 06/09/20  Aeriel Boulay S, PA-C  famotidine (PEPCID) 20 MG tablet Take 1 tablet (20 mg total) by mouth 2 (two) times daily. 06/02/20   Rasheema Truluck S, PA-C  fluconazole (DIFLUCAN) 150 MG tablet Take one tablet on the day you fill the  prescription. If you continue to have symptoms then take the second tablet in 3 days. 06/02/20   Aliannah Holstrom S, PA-C  medroxyPROGESTERone (PROVERA) 10 MG tablet Take 10 mg by mouth as directed. Take 1 tablet daily for 10 days every 2 months if you have not had a period    [provider]  metroNIDAZOLE (FLAGYL) 500 MG tablet Take 1 tablet (500 mg total) by mouth 2 (two) times daily for 7 days. 06/02/20 06/09/20  Maximillion Gill S, PA-C  ondansetron (ZOFRAN ODT) 4 MG disintegrating tablet Take 1 tablet (4 mg total) by mouth every 8 (eight) hours as needed. 08/07/19   Nita SickleVeronese, [place], MD  ondansetron (ZOFRAN) 4 MG tablet Take 1 tablet (4 mg total) by mouth  every 6 (six) hours. 06/02/20   Deionte Spivack S, PA-C  spironolactone (ALDACTONE) 50 MG tablet Take 50 mg by mouth daily. 05/19/19   [provider]  traZODone (DESYREL) 50 MG tablet Take 50-100 mg by mouth at bedtime as needed for sleep. 08/05/19   [provider]    Allergies    Patient has no known allergies.  Review of Systems   Review of Systems  Constitutional: Negative for chills and fever.  HENT: Negative for ear pain and sore throat.   Eyes: Negative for pain and visual disturbance.  Respiratory: Negative for cough and shortness of breath.   Cardiovascular: Negative for chest pain.  Gastrointestinal: Positive for abdominal pain and nausea. Negative for constipation, diarrhea and vomiting.  Genitourinary: Negative for dysuria and hematuria.  Musculoskeletal: Negative for back pain.  Skin: Negative for color change and rash.  Neurological: Negative for headaches.  All other systems reviewed and are negative.   Physical Exam Updated Vital Signs BP (!) 173/97 (BP Location: Right Arm)   Pulse 66   Temp 98.4 F (36.9 C) (Oral)   Resp 16   LMP 05/27/2020   SpO2 100%   Physical Exam Vitals and nursing note reviewed.  Constitutional:      General: She is not in acute distress.    Appearance: She is well-developed.  HENT:     Head: Normocephalic and atraumatic.  Eyes:     Conjunctiva/sclera: Conjunctivae normal.  Cardiovascular:     Rate and Rhythm: Normal rate and regular rhythm.     Heart sounds: No murmur heard.   Pulmonary:     Effort: Pulmonary effort is normal. No respiratory distress.     Breath sounds: Normal breath sounds. No wheezing, rhonchi or rales.  Abdominal:     Palpations: Abdomen is soft.     Tenderness: There is abdominal tenderness in the right upper quadrant. There is right CVA tenderness. There is no guarding or rebound.  Musculoskeletal:     Cervical back: Neck supple.  Skin:    General: Skin is warm and dry.    Neurological:     Mental Status: She is alert.     ED Results / Procedures / Treatments   Labs (all labs ordered are listed, but only abnormal results are displayed) Labs Reviewed  WET PREP, GENITAL - Abnormal; Notable for the following components:      Result Value   Trich, Wet Prep PRESENT (*)    Clue Cells Wet Prep HPF POC PRESENT (*)    WBC, Wet Prep HPF POC MANY (*)    All other components within normal limits  URINALYSIS, ROUTINE W REFLEX MICROSCOPIC - Abnormal; Notable for the following components:   APPearance CLOUDY (*)  Hgb urine dipstick LARGE (*)    Leukocytes,Ua LARGE (*)    RBC / HPF >50 (*)    WBC, UA >50 (*)    Bacteria, UA RARE (*)    Trichomonas, UA PRESENT (*)    All other components within normal limits  COMPREHENSIVE METABOLIC PANEL - Abnormal; Notable for the following components:   Glucose, Bld 127 (*)    All other components within normal limits  I-STAT BETA HCG BLOOD, ED ([facility], [facility], [facility] ONLY) - Abnormal; Notable for the following components:   I-stat hCG, quantitative 15.6 (*)    All other components within normal limits  URINE CULTURE  CBC  LIPASE, BLOOD  PREGNANCY, URINE  HIV ANTIBODY (ROUTINE TESTING W REFLEX)  RPR  GC/CHLAMYDIA PROBE AMP ([place]) NOT AT [facility]    EKG None  Radiology US Abdomen Limited RUQ  Result Date: 06/02/2020 CLINICAL DATA:  Right upper quadrant abdominal pain EXAM: ULTRASOUND ABDOMEN LIMITED RIGHT UPPER QUADRANT COMPARISON:  Ultrasound dated Jan 15, 2007 FINDINGS: Gallbladder: No gallstones or wall thickening visualized. No sonographic Murphy sign noted by sonographer. Common bile duct: Diameter: 4 mm Liver: No focal lesion identified. Within normal limits in parenchymal echogenicity. Portal vein is patent on color Doppler imaging with normal direction of blood flow towards the liver. Other: None. IMPRESSION: Unremarkable exam. Electronically Signed   By: Katherine Mantle M.D.   On: 06/02/2020 20:42     Procedures Procedures (including critical care time)  Medications Ordered in ED Medications  lidocaine (PF) (XYLOCAINE) 1 % injection (has no administration in time range)  sodium chloride 0.9 % bolus 500 mL (0 mLs Intravenous Stopped 06/02/20 2146)  ondansetron (ZOFRAN) injection 4 mg (4 mg Intravenous Given 06/02/20 2000)  famotidine (PEPCID) IVPB 20 mg premix (0 mg Intravenous Stopped 06/02/20 2146)  cefTRIAXone (ROCEPHIN) injection 500 mg (500 mg Intramuscular Given 06/02/20 2216)    ED Course  I have reviewed the triage vital signs and the nursing notes.  Pertinent labs & imaging results that were available during my care of the patient were reviewed by me and considered in my medical decision making (see chart for details).    MDM Rules/Calculators/A&P                          39 year old female presented emergency department today for evaluation of right upper quadrant abdominal pain, acid reflux symptoms for the last week  Reviewed/interpreted labs CBC is without leukocytosis or anemia CMP with normal electrolytes, kidney and liver function Lipase negative UA with hematuria, large leukocytes, greater than 50 RBCs, greater than 50 WBCs, rare bacteria, 21-50 epithelial cells present so likely contaminated and trichomonas also present.  Urine culture was sent.  We will have her continue on her Keflex that she was given this by prior provider and urine does look infected.  Unclear if this is all due to her concurrent STD. Urine pregnancy test negative, beta-hCG likely false positive  Wet prep showed trichomoniasis, clue cells and WBCs suggestive of BV.  We will treat with Flagyl.  No uterine, adnexal or CMT tenderness to suggest PID.  GC/chlamydia pending.  Given dose of ceftriaxone in emergency department and Rx for doxycycline given for home.  Patient was given IV fluids, Zofran and Pepcid in the emergency department.  On reassessment she feels much improved.  Her right upper  quadrant ultrasound does not show any evidence of gallstones or cholecystitis.  Suspect symptoms may be related to  acid reflux therefore will give Rx for Pepcid for home and will also give Zofran for nausea.  Advised that she follow-up with her PCP next week and return the emergency department for new or worsening symptoms.  She voices understanding of plan and reasons to return.  All Questions answered.  Patient stable for discharge.  Final Clinical Impression(s) / ED Diagnoses Final diagnoses:  RUQ abdominal pain  Trichomonas infection  Acute cystitis without hematuria  Bacterial vaginosis    Rx / DC Orders ED Discharge Orders         Ordered    metroNIDAZOLE (FLAGYL) 500 MG tablet  2 times daily        06/02/20 2214    doxycycline (VIBRAMYCIN) 100 MG capsule  2 times daily        06/02/20 2214    fluconazole (DIFLUCAN) 150 MG tablet        06/02/20 2214    famotidine (PEPCID) 20 MG tablet  2 times daily        06/02/20 2214    ondansetron (ZOFRAN) 4 MG tablet  Every 6 hours        06/02/20 2214           Karrie Meres, PA-C 06/02/20 2223    Tilden Fossa, MD 06/03/20 2013

## 2020-06-03 LAB — RPR: RPR Ser Ql: NONREACTIVE

## 2020-06-03 LAB — GC/CHLAMYDIA PROBE AMP (~~LOC~~) NOT AT ARMC
Chlamydia: NEGATIVE
Comment: NEGATIVE
Comment: NORMAL
Neisseria Gonorrhea: NEGATIVE

## 2020-06-04 LAB — URINE CULTURE: Culture: NO GROWTH

## 2020-06-12 ENCOUNTER — Encounter (HOSPITAL_COMMUNITY): Payer: Self-pay | Admitting: Emergency Medicine

## 2020-06-12 ENCOUNTER — Other Ambulatory Visit: Payer: Self-pay

## 2020-06-12 ENCOUNTER — Emergency Department (HOSPITAL_COMMUNITY)
Admission: EM | Admit: 2020-06-12 | Discharge: 2020-06-12 | Disposition: A | Discharge status: Home / Self Care | Payer: Managed Care, Other (non HMO) | Attending: Emergency Medicine | Admitting: Emergency Medicine

## 2020-06-12 DIAGNOSIS — R1084 Generalized abdominal pain: Secondary | ICD-10-CM | POA: Diagnosis present

## 2020-06-12 DIAGNOSIS — I10 Essential (primary) hypertension: Secondary | ICD-10-CM | POA: Insufficient documentation

## 2020-06-12 DIAGNOSIS — Z79899 Other long term (current) drug therapy: Secondary | ICD-10-CM | POA: Insufficient documentation

## 2020-06-12 DIAGNOSIS — K219 Gastro-esophageal reflux disease without esophagitis: Secondary | ICD-10-CM | POA: Insufficient documentation

## 2020-06-12 DIAGNOSIS — Z87891 Personal history of nicotine dependence: Secondary | ICD-10-CM | POA: Insufficient documentation

## 2020-06-12 HISTORY — DX: Gastro-esophageal reflux disease without esophagitis: K21.9

## 2020-06-12 LAB — CBC
HCT: 48.2 % — ABNORMAL HIGH (ref 36.0–46.0)
Hemoglobin: 15 g/dL (ref 12.0–15.0)
MCH: 27.5 pg (ref 26.0–34.0)
MCHC: 31.1 g/dL (ref 30.0–36.0)
MCV: 88.3 fL (ref 80.0–100.0)
Platelets: 408 10*3/uL — ABNORMAL HIGH (ref 150–400)
RBC: 5.46 MIL/uL — ABNORMAL HIGH (ref 3.87–5.11)
RDW: 13.3 % (ref 11.5–15.5)
WBC: 8 10*3/uL (ref 4.0–10.5)
nRBC: 0 % (ref 0.0–0.2)

## 2020-06-12 LAB — URINALYSIS, ROUTINE W REFLEX MICROSCOPIC
Bilirubin Urine: NEGATIVE
Glucose, UA: NEGATIVE mg/dL
Hgb urine dipstick: NEGATIVE
Ketones, ur: 5 mg/dL — AB
Leukocytes,Ua: NEGATIVE
Nitrite: NEGATIVE
Protein, ur: NEGATIVE mg/dL
Specific Gravity, Urine: 1.005 (ref 1.005–1.030)
pH: 6 (ref 5.0–8.0)

## 2020-06-12 LAB — COMPREHENSIVE METABOLIC PANEL
ALT: 35 U/L (ref 0–44)
AST: 32 U/L (ref 15–41)
Albumin: 4.3 g/dL (ref 3.5–5.0)
Alkaline Phosphatase: 50 U/L (ref 38–126)
Anion gap: 12 (ref 5–15)
BUN: 7 mg/dL (ref 6–20)
CO2: 22 mmol/L (ref 22–32)
Calcium: 8.8 mg/dL — ABNORMAL LOW (ref 8.9–10.3)
Chloride: 103 mmol/L (ref 98–111)
Creatinine, Ser: 0.95 mg/dL (ref 0.44–1.00)
GFR, Estimated: >60 mL/min (ref >60)
Glucose, Bld: 95 mg/dL (ref 70–99)
Potassium: 3.4 mmol/L — ABNORMAL LOW (ref 3.5–5.1)
Sodium: 137 mmol/L (ref 135–145)
Total Bilirubin: 0.8 mg/dL (ref 0.3–1.2)
Total Protein: 7.7 g/dL (ref 6.5–8.1)

## 2020-06-12 LAB — I-STAT BETA HCG BLOOD, ED (MC, WL, AP ONLY): I-stat hCG, quantitative: <5 m[IU]/mL (ref <5)

## 2020-06-12 LAB — LIPASE, BLOOD: Lipase: 20 U/L (ref 11–51)

## 2020-06-12 MED ORDER — PANTOPRAZOLE SODIUM 40 MG PO TBEC
40.0000 mg | DELAYED_RELEASE_TABLET | Freq: Once | ORAL | Status: AC
  Administered 2020-06-12: 40 mg via ORAL
  Filled 2020-06-12: qty 1

## 2020-06-12 MED ORDER — PANTOPRAZOLE SODIUM 40 MG PO TBEC: 40.0000 mg | DELAYED_RELEASE_TABLET | Freq: Every day | ORAL | 0 refills | Status: AC

## 2020-06-12 NOTE — Discharge Instructions (Signed)
Take Protonix daily. Bland foods as tolerated. Consider lactose free diet. Follow up with GI, referral given.

## 2020-06-12 NOTE — ED Triage Notes (Signed)
C/o generalized abd pain x 2 weeks.  States pain is constant.  Reports nausea was relieved with ginger root capsules.  Denies vomiting, diarrhea, and urinary complaint.  Hx of GERD but states this feels different.

## 2020-06-12 NOTE — ED Provider Notes (Signed)
[facility] EMERGENCY DEPARTMENT Provider Note   CSN: [id number] Arrival date & time: 06/12/20  1634     History Chief Complaint  Patient presents with   Abdominal Pain    Mary Carpenter is a 39 y.o. female.  39year old female presents with complaint of ongoing abdominal discomfort. Patient reports onset of symptoms 2 weeks ago, seen here at that time and diagnosed with trich and BV and completed a course of Flagyl. Patient had labs and a RUQ [place] without acute/significant findings. Patient reports ongoing lower abdominal cramping, worse with eating. Patient ate [place] food yesterday which resulted in abdominal cramping, again today after eating a cheese danish and then a cheese stick. Reports nausea which has improved with ginger OTC, denies vomiting, changes in bowel or bladder habits or vaginal complaints. Denies fevers. Patient is taking Pepcid and Peptobismal without improvement in her pain. No other complaints or concerns.         Past Medical History:  Diagnosis Date   Allergic rhinitis    Chicken pox    Depression    GERD (gastroesophageal reflux disease)    HPV (human papilloma virus) infection    Hyperlipidemia    Hypertension     Patient Active Problem List   Diagnosis Date Noted   GERD (gastroesophageal reflux disease) 10/31/2015   Constipation 10/31/2015   Atypical chest pain 10/31/2015   Allergic rhinitis 10/31/2015   Anxiety and depression 10/31/2015   Elevated blood pressure (not hypertension) 10/31/2015    History reviewed. No pertinent surgical history.   OB History   No obstetric history on file.     Family History  Problem Relation Age of Onset   Diabetes Mother    Mental illness Mother    Hypertension Mother    Hypertension Father    Hypertension Maternal Grandmother    Diabetes Maternal Grandmother    Diabetes Paternal Grandmother    Hypertension Paternal Grandmother     Social History    Tobacco Use   Smoking status: Former Smoker    Quit date: 10/31/2014    Years since quitting: 5.6   Smokeless tobacco: Never Used  Vaping Use   Vaping Use: Never used  Substance Use Topics   Alcohol use: Yes    Alcohol/week: 0.0 - 2.0 standard drinks   Drug use: No    Home Medications Prior to Admission medications   Medication Sig Start Date End Date Taking? Authorizing Provider  albuterol (PROVENTIL HFA;VENTOLIN HFA) 108 (90 Base) MCG/ACT inhaler Inhale 2 puffs into the lungs every 6 (six) hours as needed for wheezing or shortness of breath. 11/08/18   Enid Derry, PA-C  Calcium Carb-Cholecalciferol (CALCIUM-VITAMIN D) 500-200 MG-UNIT tablet Take 1 tablet by mouth 2 (two) times daily.    [provider]  fluconazole (DIFLUCAN) 150 MG tablet Take one tablet on the day you fill the prescription. If you continue to have symptoms then take the second tablet in 3 days. 06/02/20   Couture, Cortni S, PA-C  medroxyPROGESTERone (PROVERA) 10 MG tablet Take 10 mg by mouth as directed. Take 1 tablet daily for 10 days every 2 months if you have not had a period    [provider]  ondansetron (ZOFRAN ODT) 4 MG disintegrating tablet Take 1 tablet (4 mg total) by mouth every 8 (eight) hours as needed. 08/07/19   Nita Sickle, MD  ondansetron (ZOFRAN) 4 MG tablet Take 1 tablet (4 mg total) by mouth every 6 (six) hours. 06/02/20  Couture, Cortni S, PA-C  pantoprazole (PROTONIX) 40 MG tablet Take 1 tablet (40 mg total) by mouth daily. 06/12/20   Jeannie Fend, PA-C  spironolactone (ALDACTONE) 50 MG tablet Take 50 mg by mouth daily. 05/19/19   [provider]  traZODone (DESYREL) 50 MG tablet Take 50-100 mg by mouth at bedtime as needed for sleep. 08/05/19   [provider]  famotidine (PEPCID) 20 MG tablet Take 1 tablet (20 mg total) by mouth 2 (two) times daily. 06/02/20 06/12/20  Couture, Cortni S, PA-C    Allergies    Patient has no known  allergies.  Review of Systems   Review of Systems  Constitutional: Negative for chills and fever.  Respiratory: Negative for shortness of breath.   Cardiovascular: Negative for chest pain.  Gastrointestinal: Positive for abdominal pain and nausea. Negative for blood in stool, constipation, diarrhea and vomiting.  Genitourinary: Negative for dysuria and frequency.  Musculoskeletal: Negative for arthralgias, back pain and myalgias.  Skin: Negative for rash and wound.  Allergic/Immunologic: Negative for immunocompromised state.  Neurological: Negative for weakness.  Hematological: Negative for adenopathy.  Psychiatric/Behavioral: Negative for confusion.  All other systems reviewed and are negative.   Physical Exam Updated Vital Signs BP (!) 157/75 (BP Location: Right Arm)    Pulse (!) 103    Temp 98.5 F (36.9 C) (Oral)    Resp 16    LMP 05/27/2020    SpO2 100%   Physical Exam Vitals and nursing note reviewed.  Constitutional:      General: She is not in acute distress.    Appearance: She is well-developed. She is not diaphoretic.  HENT:     Head: Normocephalic and atraumatic.  Cardiovascular:     Rate and Rhythm: Normal rate and regular rhythm.     Heart sounds: Normal heart sounds.  Pulmonary:     Effort: Pulmonary effort is normal.     Breath sounds: Normal breath sounds.  Abdominal:     Palpations: Abdomen is soft.     Tenderness: There is abdominal tenderness. There is no right CVA tenderness, left CVA tenderness, guarding or rebound.     Comments: Mild generalized tenderness across lower abdomen  Skin:    General: Skin is warm and dry.     Findings: No erythema or rash.  Neurological:     Mental Status: She is alert and oriented to person, place, and time.  Psychiatric:        Behavior: Behavior normal.     ED Results / Procedures / Treatments   Labs (all labs ordered are listed, but only abnormal results are displayed) Labs Reviewed  COMPREHENSIVE METABOLIC  PANEL - Abnormal; Notable for the following components:      Result Value   Potassium 3.4 (*)    Calcium 8.8 (*)    All other components within normal limits  CBC - Abnormal; Notable for the following components:   RBC 5.46 (*)    HCT 48.2 (*)    Platelets 408 (*)    All other components within normal limits  URINALYSIS, ROUTINE W REFLEX MICROSCOPIC - Abnormal; Notable for the following components:   Color, Urine STRAW (*)    Ketones, ur 5 (*)    All other components within normal limits  LIPASE, BLOOD  I-STAT BETA HCG BLOOD, ED ([facility], [facility], [facility] ONLY)    EKG None  Radiology No results found.  Procedures Procedures (including critical care time)  Medications Ordered in ED Medications  pantoprazole (  PROTONIX) EC tablet 40 mg (40 mg Oral Given 06/12/20 2235)    ED Course  I have reviewed the triage vital signs and the nursing notes.  Pertinent labs & imaging results that were available during my care of the patient were reviewed by me and considered in my medical decision making (see chart for details).  Clinical Course as of Jun 12 2324  Wynelle Link Jun 12, 2020  [id number] 39 year old female with ongoing abdominal pain, worse with eating. On exam, mild lower abdominal pain, no rebound or guarding.  Labs without significant findings or changes compared to labs at previous ER visit.  Recommend Protonix and GI referral. Trial bland diet, advance slowly as tolerated. Reports pain after eating daily, will try lactose free diet. Return to ER for worsening or concerning symptoms.    [LM]    Clinical Course User Index [LM] Alden Hipp   MDM Rules/Calculators/A&P                          Final Clinical Impression(s) / ED Diagnoses Final diagnoses:  Generalized abdominal pain    Rx / DC Orders ED Discharge Orders         Ordered    pantoprazole (PROTONIX) 40 MG tablet  Daily        06/12/20 2222           Jeannie Fend, PA-C 06/12/20 2326    Margarita Grizzle,  MD 06/14/20 1154

## 2021-07-15 IMAGING — US US ABDOMEN LIMITED
1 series · 14 of 25 positions shown · non-contrast
Comparison: Ultrasound dated January 15, 2007

CLINICAL DATA: Right upper quadrant abdominal pain

EXAM:
ULTRASOUND ABDOMEN LIMITED RIGHT UPPER QUADRANT

[Series 1: us abdomen limited ruq · 14 of 31 slices shown]
[im 1/31]
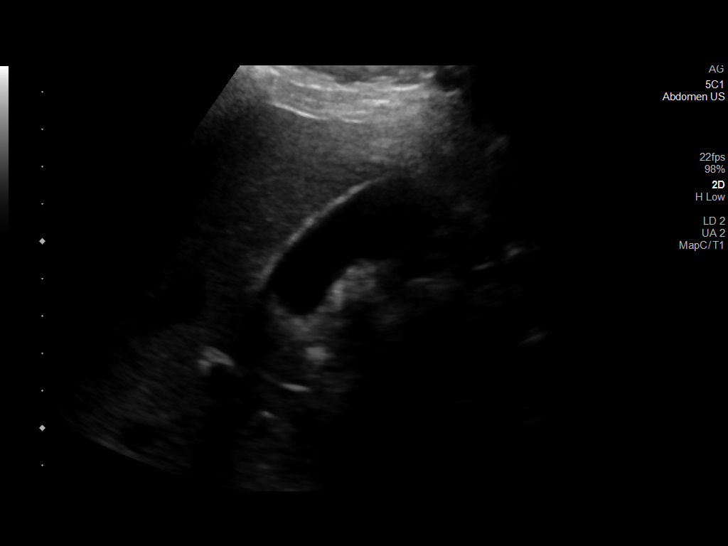
[im 3/31]
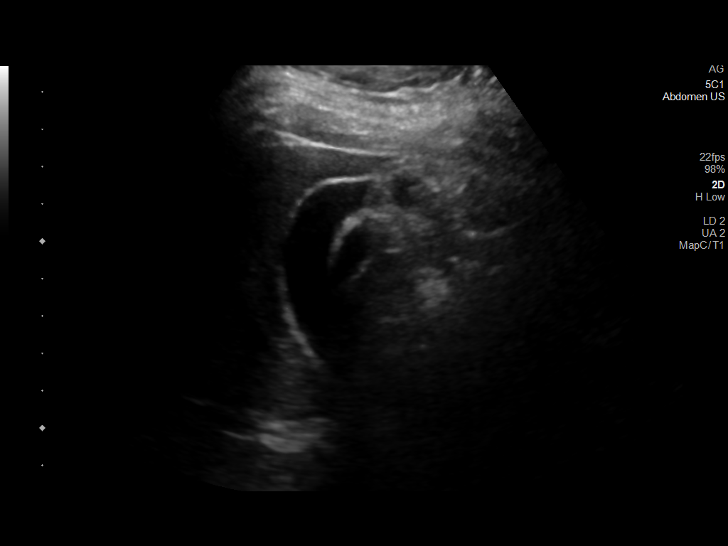
[im 6/31]
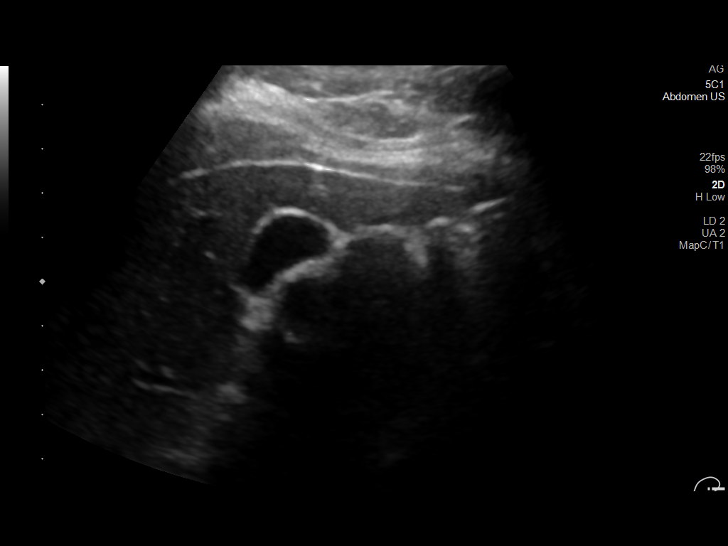
[im 8/31]
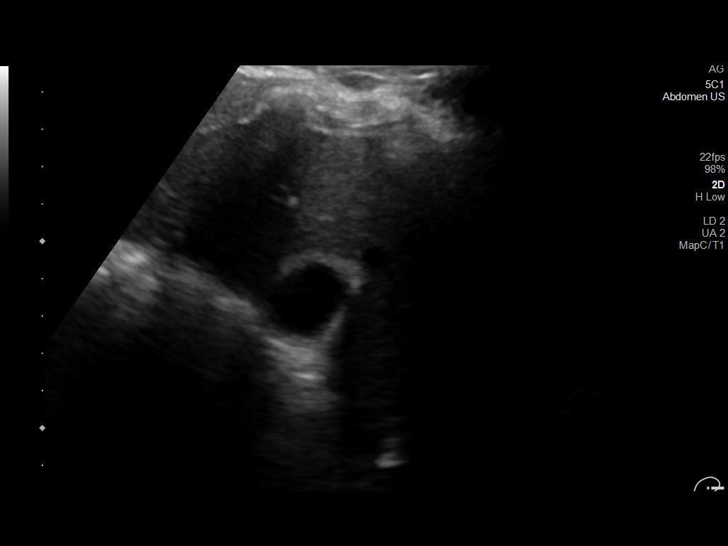
[im 11/31]
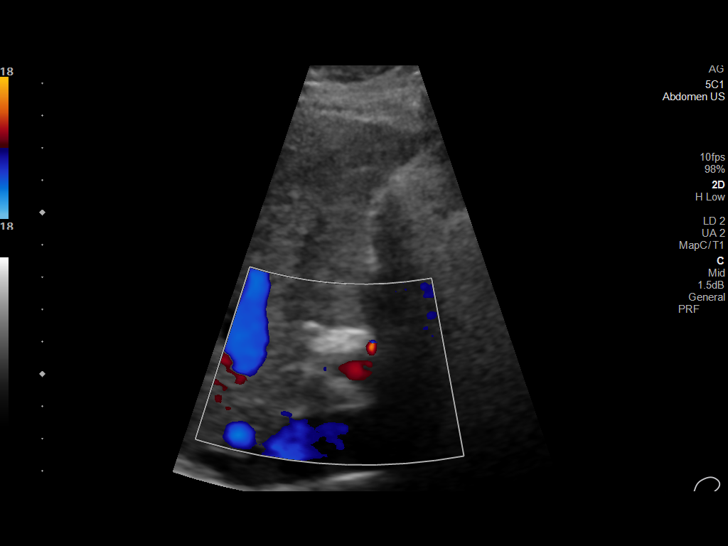
[im 12/31]
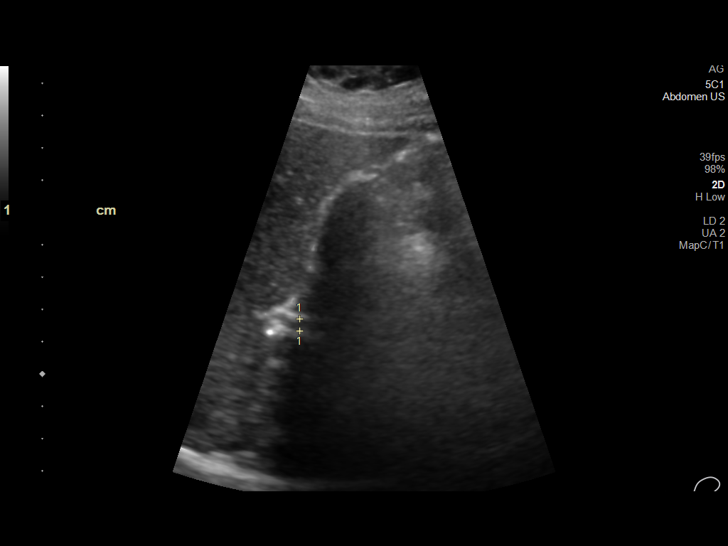
[im 14/31]
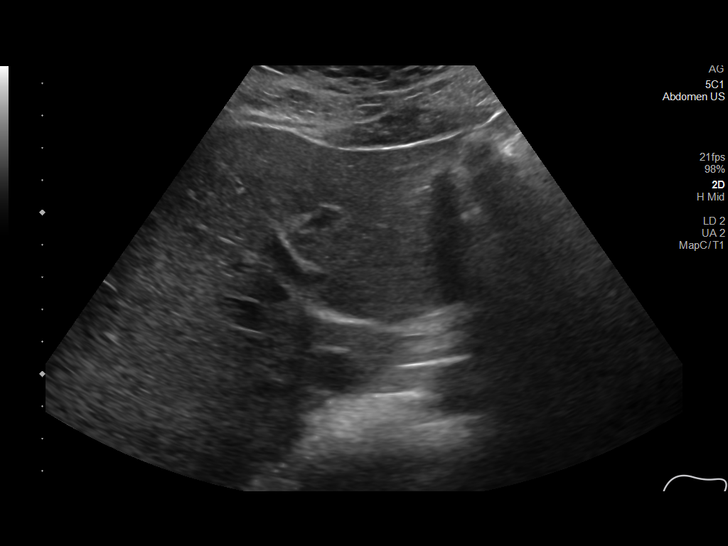
[im 17/31]
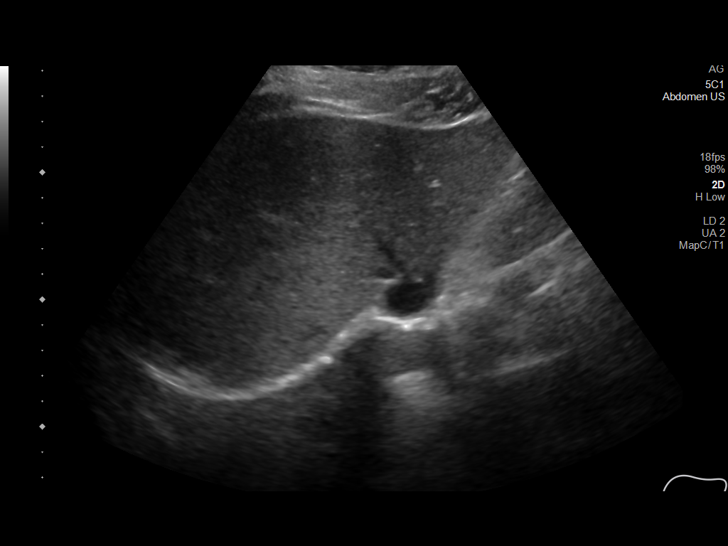
[im 19/31]
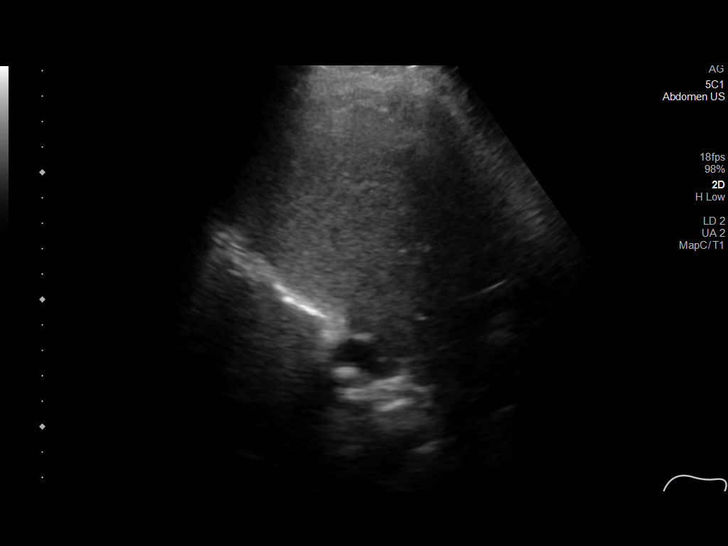
[im 21/31]
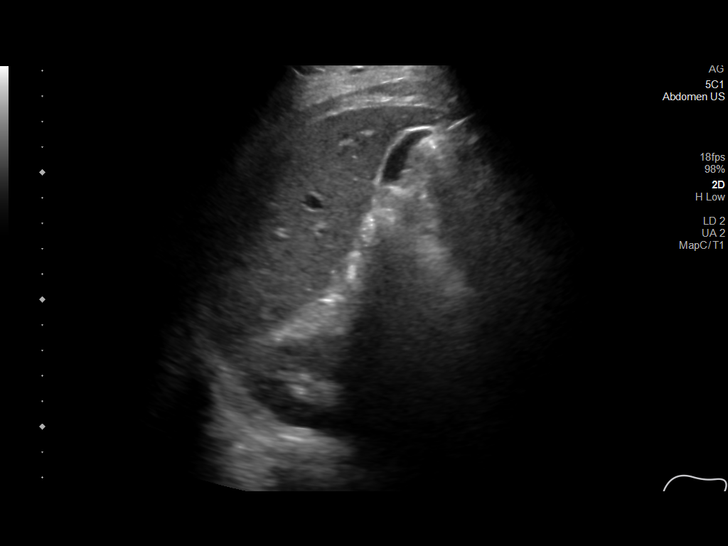
[im 23/31]
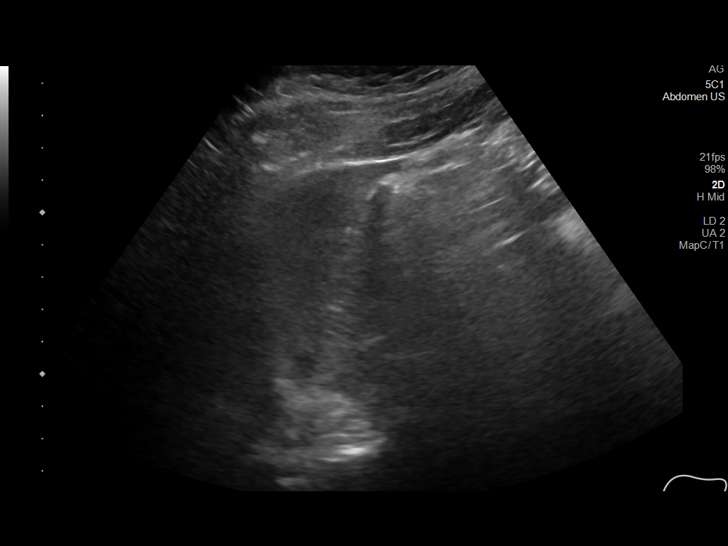
[im 26/31]
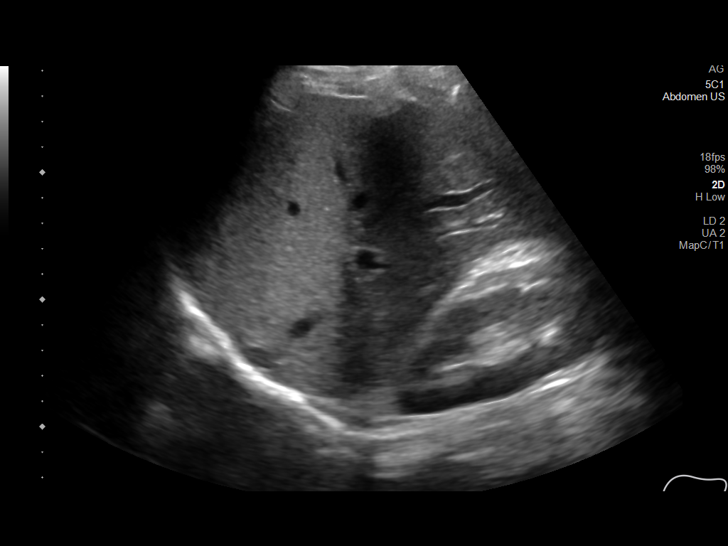
[im 28/31]
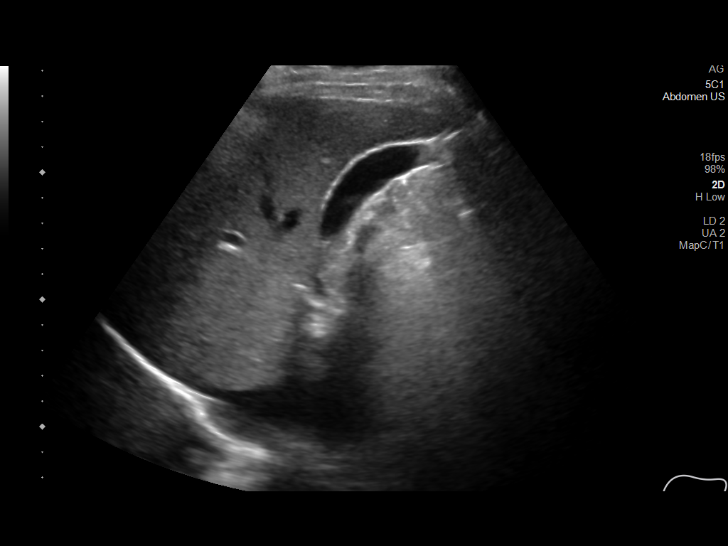
[im 31/31]
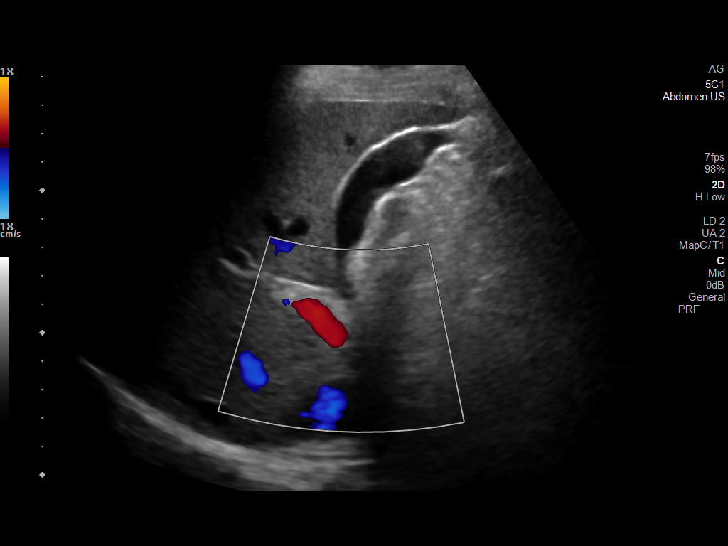

[14 of 25 positions shown; findings below may reference images not displayed]

FINDINGS: Gallbladder:

No gallstones or wall thickening visualized. No sonographic Murphy
sign noted by sonographer.

Common bile duct:

Diameter: 4 mm

Liver:

No focal lesion identified. Within normal limits in parenchymal
echogenicity. Portal vein is patent on color Doppler imaging with
normal direction of blood flow towards the liver.

Other: None.
IMPRESSION: Unremarkable exam.
